# Patient Record
Sex: Male | Born: 1955 | Race: White | Hispanic: No | Marital: Married | Smoking: Never smoker
Health system: Southern US, Community
[De-identification: ages and names within clinical notes are randomized; demographics above are authoritative.]

## PROBLEM LIST (undated history)

## (undated) DIAGNOSIS — I2510 Atherosclerotic heart disease of native coronary artery without angina pectoris: Secondary | ICD-10-CM

## (undated) DIAGNOSIS — I10 Essential (primary) hypertension: Secondary | ICD-10-CM

## (undated) DIAGNOSIS — E785 Hyperlipidemia, unspecified: Secondary | ICD-10-CM

## (undated) HISTORY — DX: Atherosclerotic heart disease of native coronary artery without angina pectoris: I25.10

## (undated) HISTORY — DX: Hyperlipidemia, unspecified: E78.5

---

## 2001-07-11 ENCOUNTER — Ambulatory Visit (HOSPITAL_COMMUNITY): Admission: RE | Admit: 2001-07-11 | Discharge: 2001-07-11 | Payer: Self-pay | Admitting: *Deleted

## 2001-07-11 ENCOUNTER — Encounter: Payer: Self-pay | Admitting: *Deleted

## 2006-06-09 ENCOUNTER — Ambulatory Visit (HOSPITAL_COMMUNITY): Admission: RE | Admit: 2006-06-09 | Discharge: 2006-06-09 | Payer: Self-pay | Admitting: *Deleted

## 2006-06-09 ENCOUNTER — Ambulatory Visit: Payer: Self-pay | Admitting: *Deleted

## 2006-06-12 ENCOUNTER — Ambulatory Visit (HOSPITAL_COMMUNITY): Admission: RE | Admit: 2006-06-12 | Discharge: 2006-06-14 | Payer: Self-pay | Admitting: Internal Medicine

## 2006-06-12 ENCOUNTER — Ambulatory Visit: Payer: Self-pay | Admitting: Internal Medicine

## 2006-07-05 ENCOUNTER — Ambulatory Visit: Payer: Self-pay | Admitting: *Deleted

## 2006-08-25 ENCOUNTER — Ambulatory Visit: Payer: Self-pay | Admitting: Cardiology

## 2006-08-29 ENCOUNTER — Ambulatory Visit (HOSPITAL_COMMUNITY): Admission: RE | Admit: 2006-08-29 | Discharge: 2006-08-30 | Payer: Self-pay | Admitting: Cardiology

## 2006-08-29 ENCOUNTER — Ambulatory Visit: Payer: Self-pay | Admitting: Cardiology

## 2006-09-08 ENCOUNTER — Ambulatory Visit: Payer: Self-pay | Admitting: Cardiology

## 2006-11-20 ENCOUNTER — Ambulatory Visit: Payer: Self-pay | Admitting: Cardiology

## 2007-04-05 ENCOUNTER — Ambulatory Visit: Payer: Self-pay | Admitting: Internal Medicine

## 2007-09-21 ENCOUNTER — Ambulatory Visit: Payer: Self-pay | Admitting: Cardiology

## 2008-07-15 ENCOUNTER — Ambulatory Visit: Admission: RE | Admit: 2008-07-15 | Discharge: 2008-07-15 | Payer: Self-pay | Admitting: Internal Medicine

## 2008-11-26 DIAGNOSIS — I248 Other forms of acute ischemic heart disease: Secondary | ICD-10-CM

## 2008-11-26 DIAGNOSIS — I10 Essential (primary) hypertension: Secondary | ICD-10-CM | POA: Insufficient documentation

## 2008-12-26 ENCOUNTER — Ambulatory Visit: Payer: Self-pay | Admitting: Cardiology

## 2009-12-17 ENCOUNTER — Encounter: Payer: Self-pay | Admitting: Cardiology

## 2009-12-18 ENCOUNTER — Encounter: Payer: Self-pay | Admitting: Cardiology

## 2009-12-21 DIAGNOSIS — E785 Hyperlipidemia, unspecified: Secondary | ICD-10-CM

## 2009-12-21 DIAGNOSIS — I2510 Atherosclerotic heart disease of native coronary artery without angina pectoris: Secondary | ICD-10-CM

## 2009-12-22 ENCOUNTER — Ambulatory Visit: Payer: Self-pay | Admitting: Cardiology

## 2009-12-23 ENCOUNTER — Encounter: Payer: Self-pay | Admitting: Cardiology

## 2010-01-06 ENCOUNTER — Ambulatory Visit (HOSPITAL_COMMUNITY): Admission: RE | Admit: 2010-01-06 | Discharge: 2010-01-06 | Payer: Self-pay | Admitting: Internal Medicine

## 2010-02-24 ENCOUNTER — Ambulatory Visit (HOSPITAL_COMMUNITY): Admission: RE | Admit: 2010-02-24 | Discharge: 2010-02-24 | Payer: Self-pay | Admitting: Internal Medicine

## 2010-03-22 ENCOUNTER — Ambulatory Visit (HOSPITAL_COMMUNITY): Admission: RE | Admit: 2010-03-22 | Discharge: 2010-03-22 | Payer: Self-pay | Admitting: Internal Medicine

## 2010-03-22 ENCOUNTER — Telehealth (INDEPENDENT_AMBULATORY_CARE_PROVIDER_SITE_OTHER): Payer: Self-pay

## 2010-06-18 ENCOUNTER — Encounter: Payer: Self-pay | Admitting: Gastroenterology

## 2010-06-28 ENCOUNTER — Encounter: Payer: Self-pay | Admitting: Gastroenterology

## 2010-06-29 ENCOUNTER — Encounter: Payer: Self-pay | Admitting: Gastroenterology

## 2010-06-30 ENCOUNTER — Encounter: Payer: Self-pay | Admitting: Gastroenterology

## 2010-07-05 ENCOUNTER — Telehealth: Payer: Self-pay | Admitting: Cardiology

## 2010-07-07 ENCOUNTER — Telehealth: Payer: Self-pay | Admitting: Cardiology

## 2010-07-07 ENCOUNTER — Telehealth (INDEPENDENT_AMBULATORY_CARE_PROVIDER_SITE_OTHER): Payer: Self-pay

## 2010-07-13 ENCOUNTER — Ambulatory Visit: Payer: Self-pay | Admitting: Gastroenterology

## 2010-07-13 ENCOUNTER — Ambulatory Visit (HOSPITAL_COMMUNITY): Admission: RE | Admit: 2010-07-13 | Discharge: 2010-07-13 | Payer: Self-pay | Admitting: Gastroenterology

## 2010-08-06 ENCOUNTER — Telehealth (INDEPENDENT_AMBULATORY_CARE_PROVIDER_SITE_OTHER): Payer: Self-pay

## 2010-08-11 ENCOUNTER — Telehealth (INDEPENDENT_AMBULATORY_CARE_PROVIDER_SITE_OTHER): Payer: Self-pay | Admitting: *Deleted

## 2010-08-27 ENCOUNTER — Emergency Department (HOSPITAL_COMMUNITY)
Admission: EM | Admit: 2010-08-27 | Discharge: 2010-08-27 | Payer: Self-pay | Source: Home / Self Care | Admitting: Emergency Medicine

## 2010-08-27 ENCOUNTER — Telehealth: Payer: Self-pay | Admitting: Cardiology

## 2011-01-16 LAB — CONVERTED CEMR LAB
ALT: 20 units/L
AST: 18 units/L
Albumin: 4.3 g/dL
Calcium: 9.1 mg/dL
Cholesterol: 128 mg/dL
Creatinine, Ser: 0.75 mg/dL
Glucose, Bld: 99 mg/dL
Sodium: 140 meq/L
Total Protein: 7 g/dL

## 2011-01-18 NOTE — Assessment & Plan Note (Signed)
Summary: per check out   Visit Type:  Follow-up Primary Provider:  dr [facility] in [place]  CC:  occ eposides of chest pain.  History of Present Illness: Patient is 55 years old and return for management of CAD. 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 In 2007 he had drug-eluting stent at the ostium and midportion of the PDA and later a DES to the LAD. Within a year he had PCI for in-stent restenosis at the ostium of the RCA by Dr. Riley Kill.  He has done quite well since that time has had no recent chest pain shortness breath or palpitations. He's not been too active physically. He does have a fair amount of stress in his job.  His other problems include hypertension hyperlipidemia. His recent lipid profile the [facility] in [place] and they're faxing [place] the results.  He has a left breast mass that may need biopsy.  Current Medications (verified): 1)  Coreg Cr 40 Mg Xr24h-Cap (Carvedilol Phosphate) .Marland Kitchen.. 1 Tab Once Daily 2)  Hydrochlorothiazide 12.5 Mg Tabs (Hydrochlorothiazide) .... Take One Tablet By Mouth Daily. 3)  Plavix 75 Mg Tabs (Clopidogrel Bisulfate) .... Take One Tablet By Mouth Daily 4)  Tricor 145 Mg Tabs (Fenofibrate) .Marland Kitchen.. 1 Tab Qd 5)  Crestor 10 Mg Tabs (Rosuvastatin Calcium) .... Take One Tablet By Mouth Daily. 6)  Lisinopril 40 Mg Tabs (Lisinopril) .... Take One Tablet By Mouth Daily 7)  Aspirin Ec 325 Mg Tbec (Aspirin) .... Take One Tablet By Mouth Daily 8)  Fish Oil   Oil (Fish Oil) .... (1,000mg  Per Tablet) 1 Tab Four Times A Day 9)  Multivitamins   Tabs (Multiple Vitamin) .Marland Kitchen.. 1 Tab Once Daily 10)  Nitrolingual 0.4 Mg/spray Soln (Nitroglycerin) .... One Spray Under Tongue Every 5 Minutes As Needed For Chest Pain---May Repeat Times Three Prn  Allergies (verified): No Known Drug Allergies  Past History:  Past Medical History: Reviewed history from 11/26/2008 and no changes required. PRESENTATION 6/07  ACUTE CORONARY SYNDROME 2 VESSEL  CAD TAXUS STENTS TO OSTIAL AND MID PDA OF RCA, TAXUS STENT TO PROX LAD, BMS TO MID RCA AT CATH 6/07 CUTTING  BALLOON ANGIOPLASTY FOR ISR OF OSTIAL LESION PDA 9/07 HTN DYSLIPIDEMIA FAMILY H/O OF CAD BORDERLINE DM  Review of Systems       ROS is negative except as outlined in HPI.   Vital Signs:  Patient profile:   55 year old male Height:      68 inches Weight:      238 pounds BMI:     36.32 Pulse rate:   68 / minute BP sitting:   140 / 72  (left arm) Cuff size:   regular  Vitals Entered By: Burnett Kanaris, CNA (December 22, 2009 10:54 AM)  Physical Exam  Additional Exam:  Gen. Well-nourished, in no distress   Neck: No JVD, thyroid not enlarged, no carotid bruits Lungs: No tachypnea, clear without rales, rhonchi or wheezes Cardiovascular: Rhythm regular, PMI not displaced,  heart sounds  normal, no murmurs or gallops, no peripheral edema, pulses normal in all 4 extremities. Abdomen: BS normal, abdomen soft and non-tender without masses or organomegaly, no hepatosplenomegaly. MS: No deformities, no cyanosis or clubbing   Neuro:  No focal sns   Skin:  no lesions    Impression & Recommendations:  Problem # 1:  CAD, NATIVE VESSEL (ICD-414.01) He had a DES  to the right coronary artery and LAD in 2007. He's had no recent  chest pain and his palm appears stable.  He may need to have a breast biopsy. I think it is okay for him to come off the Plavix for 5 days prior to the biopsy. I prefer that he stay on aspirin through the biopsy. If this can be done under local anesthesia no taking need further preop evaluation. It is done under general anesthesia then I would recommend that he have a stress Myoview prior to surgery. His updated medication list for this problem includes:    Coreg Cr 40 Mg Xr24h-cap (Carvedilol phosphate) .Marland Kitchen... 1 tab once daily    Plavix 75 Mg Tabs (Clopidogrel bisulfate) .Marland Kitchen... Take one tablet by mouth daily    Lisinopril 40 Mg Tabs (Lisinopril) .Marland Kitchen... Take one  tablet by mouth daily    Aspirin Ec 325 Mg Tbec (Aspirin) .Marland Kitchen... Take one tablet by mouth daily    Nitrostat 0.4 Mg Subl (Nitroglycerin) .Marland Kitchen... 1 tablet under tongue at onset of chest pain; you may repeat every 5 minutes for up to 3 doses.  Orders: EKG w/ Interpretation (93000)  Problem # 2:  HYPERTENSION, BENIGN (ICD-401.1) His blood pressure is slightly elevated today but he says it runs about 130/70 when he checks it at home. We will continue current therapy. His updated medication list for this problem includes:    Coreg Cr 40 Mg Xr24h-cap (Carvedilol phosphate) .Marland Kitchen... 1 tab once daily    Hydrochlorothiazide 12.5 Mg Tabs (Hydrochlorothiazide) .Marland Kitchen... Take one tablet by mouth daily.    Lisinopril 40 Mg Tabs (Lisinopril) .Marland Kitchen... Take one tablet by mouth daily    Aspirin Ec 325 Mg Tbec (Aspirin) .Marland Kitchen... Take one tablet by mouth daily  His updated medication list for this problem includes:    Coreg Cr 40 Mg Xr24h-cap (Carvedilol phosphate) .Marland Kitchen... 1 tab once daily    Hydrochlorothiazide 12.5 Mg Tabs (Hydrochlorothiazide) .Marland Kitchen... Take one tablet by mouth daily.    Lisinopril 40 Mg Tabs (Lisinopril) .Marland Kitchen... Take one tablet by mouth daily    Aspirin Ec 325 Mg Tbec (Aspirin) .Marland Kitchen... Take one tablet by mouth daily  Problem # 3:  HYPERLIPIDEMIA-MIXED (ICD-272.4)  He had a recent lipid profile at the free clinic in [place]. We will try and obtain those results. His updated medication list for this problem includes:    Tricor 145 Mg Tabs (Fenofibrate) .Marland Kitchen... 1 tab qd    Crestor 10 Mg Tabs (Rosuvastatin calcium) .Marland Kitchen... Take one tablet by mouth daily.  His updated medication list for this problem includes:    Tricor 145 Mg Tabs (Fenofibrate) .Marland Kitchen... 1 tab qd    Crestor 10 Mg Tabs (Rosuvastatin calcium) .Marland Kitchen... Take one tablet by mouth daily.  Patient Instructions: 1)  Your physician wants you to follow-up in: 1 year with Dr. Diona Browner in [place].   You will receive a reminder letter in the mail two months in  advance. If you don't receive a letter, please call our office to schedule the follow-up appointment. Prescriptions: NITROSTAT 0.4 MG SUBL (NITROGLYCERIN) 1 tablet under tongue at onset of chest pain; you may repeat every 5 minutes for up to 3 doses.  #25 x 3   Entered by:   Sherri Rad, RN, BSN   Authorized by:   Lenoria Farrier, MD, [facility]   Signed by:   Sherri Rad, RN, BSN on 12/22/2009   Method used:   Faxed to ...       [facility] [address] [employer]. (retail)       [address]       [place], [place]  [address]       Ph: [phone]       Fax: [phone]   RxID:   [id number] LISINOPRIL 40 MG TABS (LISINOPRIL) Take one tablet by mouth daily  #90 x 3   Entered by:   Sherri Rad, RN, BSN   Authorized by:   Lenoria Farrier, MD, [facility]   Signed by:   Sherri Rad, RN, BSN on 12/22/2009   Method used:   Faxed to ...       [facility] [address] [employer] (retail)       [address]       [place], [place]  [address]       Ph: [phone]       Fax: [phone]   RxID:   [id number] CRESTOR 10 MG TABS (ROSUVASTATIN CALCIUM) Take one tablet by mouth daily.  #90 x 3   Entered by:   Sherri Rad, RN, BSN   Authorized by:   Lenoria Farrier, MD, [facility]   Signed by:   Sherri Rad, RN, BSN on 12/22/2009   Method used:   Faxed to ...       [facility] [address] [employer] (retail)       [address]       [place], [place]  [address]       Ph: [phone]       Fax: [phone]   RxID:   [id number] TRICOR 145 MG TABS (FENOFIBRATE) 1 tab qd  #90 x 3   Entered by:   Sherri Rad, RN, BSN   Authorized by:   Lenoria Farrier, MD, [facility]   Signed by:   Sherri Rad, RN, BSN on 12/22/2009   Method used:   Faxed to ...       [facility] [address] [employer] (retail)       [address]       [place], [place]  [address]       Ph: [phone]       Fax: [phone]   RxID:   [id number] PLAVIX 75 MG TABS (CLOPIDOGREL BISULFATE) Take one tablet by mouth daily  #90 x 3   Entered by:   Sherri Rad, RN, BSN   Authorized by:   Lenoria Farrier, MD, [facility]   Signed by:   Sherri Rad, RN, BSN on 12/22/2009   Method used:   Faxed to ...       [facility] [address] [employer] (retail)       [address]       [place], [place]  [address]       Ph: [phone]       Fax: [phone]   RxID:   [id number] HYDROCHLOROTHIAZIDE 12.5 MG TABS (HYDROCHLOROTHIAZIDE) Take one tablet by mouth daily.  #90 x 3   Entered by:   Sherri Rad, RN, BSN   Authorized by:   Lenoria Farrier, MD, [facility]   Signed by:   Sherri Rad, RN, BSN on 12/22/2009   Method used:   Faxed to ...       [facility] [address] [employer] (retail)       [address]       [place], [place]  [address]       Ph: [phone]       Fax: [phone]   RxID:   [id number] COREG CR 40 MG XR24H-CAP (CARVEDILOL PHOSPHATE) 1 tab once daily  #90 x 3   Entered by:   Sherri Rad, RN, BSN   Authorized by:   Lenoria Farrier, MD, [facility]   Signed by:   Sherri Rad, RN, BSN on 12/22/2009   Method used:   Faxed to ...       [facility] [address] [employer] (retail)       [address]       [place], [place]  [address]       Ph: [phone]       Fax: [phone]   RxID:   [id number]

## 2011-01-18 NOTE — Letter (Signed)
Summary: Internal Other /[facility] note/06/17/2010  Internal Other /[facility] note/06/17/2010   Imported By: Cloria Spring LPN 95/62/1308 65:78:46  _____________________________________________________________________  External Attachment:    Type:   Image     Comment:   External Document

## 2011-01-18 NOTE — Progress Notes (Signed)
Summary: Pt want a call regarding him having surgery**awaiting BB**  Phone Note Call from Patient Call back at Home Phone [phone]   Caller: [facility] Summary of Call: Pt having surgery and want to talk to Dr.Rene Sizelove regarding this matter and him taking Plavix Initial call taken by: Judie Grieve,  July 05, 2010 10:31 AM  Follow-up for Phone Call        pt having colonscopy on next Mon 7/25 and also will need surgery for diverticulitis, needs clearance to be off asa and plavix, will send to Dr Juanda Chance for review Meredith Staggers, RN  July 05, 2010 11:16 AM      Appended Document: Pt want a call regarding him having surgery**awaiting BB** Heather, We can call on Wed. I think he can come off as long as he stays on ASA.  His last PCI was few yrs ago. BB

## 2011-01-18 NOTE — Progress Notes (Signed)
Summary: c/o horrible pain neck down left arm  Phone Note Call from Patient Call back at Home Phone [phone] Call back at [phone]   Caller: Patient Reason for Call: Talk to Nurse Summary of Call: c/o horrible pain worse at night, neck down left arm. unable to lift up arm. h/o stent placement 07.  Initial call taken by: Lorne Skeens,  August 27, 2010 9:59 AM  Follow-up for Phone Call        [facility] Sherri Rad, RN, BSN  August 27, 2010 11:53 AM   Additional Follow-up for Phone Call Additional follow up Details #1::        pt rtn call pls call [phone] ext [phone] Lela Graham  August 27, 2010 12:03 PM   I called and spoke with the pt. He states that he has had left sided arm pain/ shoulder pain that goes to the elbow. He does not have any chest pain. His symptoms are not similar to prior to his stent placement. His pain has been ongoing for a few days. He states it is worse when he lifts his arm. I explained that this sounds more like a rotator cuff problem or possibly a nerve or spine involvement. I have advised the pt to contact his PCP. He will call our office back should he develop any CP. He is agreeable with the above. Additional Follow-up by: Sherri Rad, RN, BSN,  August 27, 2010 12:13 PM

## 2011-01-18 NOTE — Progress Notes (Signed)
Summary: pt's wife calling back re message from dr Juanda Chance   Phone Note Call from Patient   Caller: Patient Reason for Call: Talk to Nurse Summary of Call: pt calling back re message left with dr Caidence Higashi-pls call [phone] Initial call taken by: Glynda Jaeger,  July 07, 2010 2:22 PM  Follow-up for Phone Call        [phone] is the wrong #. I called the pt's home #. It is busy x 2 tries. Sherri Rad, RN, BSN  July 07, 2010 2:37 PM  pt now calling to say pt told to stop plavix before surgery from 7-21 to 7-26-dr Shameeka Silliman has told him not to ever stop it-is this alright Glynda Jaeger  July 07, 2010 4:27 PM I called the pt's wife and informed her that Dr. Juanda Chance is in the office tomorrow and we will review and call her back then. Follow-up by: Sherri Rad, RN, BSN,  July 07, 2010 5:46 PM  Additional Follow-up for Phone Call Additional follow up Details #1::        pls call patient at [phone] becasue wife had to go out and cant take the call Omer Jack  July 08, 2010 12:37 PM   I reviewed with Dr. Juanda Chance, the pt is ok to hold plavix for 5 days prior to procedure. The pt is aware.  Additional Follow-up by: Sherri Rad, RN, BSN,  July 08, 2010 1:16 PM

## 2011-01-18 NOTE — Progress Notes (Signed)
Summary: PHONE NOTE  Phone Note From Other Clinic   Caller: [facility] Summary of Call: Angelica Chessman from the [facility] called and stated the Mr. Gann has been set up with a Surgeon on Monday August 29th 2011 Initial call taken by: Donzetta Starch

## 2011-01-18 NOTE — Progress Notes (Signed)
Summary: phone note/ abd pain and mucus in stool  Phone Note Call from Patient   Caller: Spouse Summary of Call: Pt's wife called and said pt has been seen by Dr. Darrick Penna at the [facility], and she did colonoscopy on him. He has had  diverticulits. He is having abdominal pain and mucus in stool but no blood in stool. She wanted to know if there is something that he can do, he is afraid that he will end up in the hospital again....and [facility]end. She is aware Dr. Darrick Penna is out of town, and Dr. Jena Gauss is doing procedures but will be informed. Pt's cell phone is [phone]. Please advise. Initial call taken by: Cloria Spring LPN,  August 06, 2010 9:18 AM     Appended Document: phone note/ abd pain and mucus in stool Per Dr. Jena Gauss, pt was told to go to ER if he has alot of pain and problems. He said it is not that bad at this time, but he is concerned as to what will happen over the week-end.  He is aware Dr. Darrick Penna is out until Tues. I told him i will make her aware when she returns.  Appended Document: phone note/ abd pain and mucus in stool Pt having pain and mucous in stool. Did not get worse over the weekend. Pain is gone. No appt with Dr. Malvin Johns. Pt still does not have appt for Surgery. Called [facility] w/ Peggy. Call me and the pt with up date on appt.

## 2011-01-18 NOTE — Progress Notes (Signed)
Summary: Diverticulitis per [name] [facility]  Phone Note From Other Clinic   Caller: [name] [facility] Summary of Call: [name], from the [facility] called in ref to pt. York Spaniel he is having more pain and abd Ct verified diverticulitis. Wants to  know what Dr. Darrick Penna recommends for him.  [name] call back number is [phone] Pt's number....[phone] Please advise. Initial call taken by: Cloria Spring LPN,  March 22, 2010 11:32 AM     Appended Document: Diverticulitis per [facility] with [name]. Pt had CIP/FLAG for 7 days. Pt's med list reviewed. Needs Augmentin 875 mg two times a day for 14 days. OPV in 3 weeks at [facility]. If pt not improved after 7 days then will need OPV sooner.

## 2011-01-18 NOTE — Progress Notes (Signed)
Summary: phone note/ ? holding meds before procedure  Phone Note Call from Patient   Caller: Patient Summary of Call: Pt's wife called and asked if pt was to hold the plavix and ASA before procedure on 07/13/2010. Please advise! Initial call taken by: Cloria Spring LPN,  July 07, 2010 3:40 PM     Appended Document: phone note/ ? holding meds before procedure Hold ASA starting tomorrow. Continue Plavix.  Appended Document: phone note/ ? holding meds before procedure Correction: HOLD PLAVIX STARTING TOMORROW. Continue ASA.  Appended Document: phone note/ ? holding meds before procedure Pt's wife informed.

## 2011-01-18 NOTE — Letter (Signed)
Summary: [facility] from [facility] [place]  records from [facility] [place]   Imported By: Rosine Beat 06/28/2010 15:02:40  _____________________________________________________________________  External Attachment:    Type:   Image     Comment:   External Document

## 2011-01-18 NOTE — Letter (Signed)
Summary: Internal Other /TCS instructions  Internal Other /TCS instructions   Imported By: Cloria Spring LPN 16/09/9603 54:09:81  _____________________________________________________________________  External Attachment:    Type:   Image     Comment:   External Document

## 2011-01-18 NOTE — Miscellaneous (Signed)
Summary: labs  Clinical Lists Changes  Observations: Added new observation of HGBA1C: 6.1 % (12/17/2009 16:59) Added new observation of LDL: 69 mg/dL (16/09/9603 54:09) Added new observation of HDL: 35 mg/dL (81/19/1478 29:56) Added new observation of TRIGLYC TOT: 120 mg/dL (21/30/8657 84:69) Added new observation of CHOLESTEROL: 128 mg/dL (62/95/2841 32:44) Added new observation of CALCIUM: 9.1 mg/dL (12/21/7251 66:44) Added new observation of ALBUMIN: 4.3 g/dL (03/47/4259 56:38) Added new observation of PROTEIN, TOT: 7.0 g/dL (75/64/3329 51:88) Added new observation of SGPT (ALT): 20 units/L (12/17/2009 16:59) Added new observation of SGOT (AST): 18 units/L (12/17/2009 16:59) Added new observation of ALK PHOS: 23 units/L (12/17/2009 16:59) Added new observation of BILI TOTAL: 0.4 mg/dL (41/66/0630 16:01) Added new observation of CREATININE: 0.75 mg/dL (09/32/3557 32:20) Added new observation of BUN: 19 mg/dL (25/42/7062 37:62) Added new observation of BG RANDOM: 99 mg/dL (83/15/1761 60:73) Added new observation of CO2 PLSM/SER: 23 meq/L (12/17/2009 16:59) Added new observation of CL SERUM: 105 meq/L (12/17/2009 16:59) Added new observation of K SERUM: 4.0 meq/L (12/17/2009 16:59) Added new observation of NA: 140 meq/L (12/17/2009 16:59)

## 2011-01-18 NOTE — Letter (Signed)
Summary: Internal Other/triage  Internal Other/triage   Imported By: Cloria Spring LPN 16/09/9603 54:09:81  _____________________________________________________________________  External Attachment:    Type:   Image     Comment:   External Document  Appended Document: Internal Other/triage Please obtain notes from [facility] visit at [facility] and scan in the computer. TRILYTE PREP.  Appended Document: Internal Other/triage Call [facility] and left message for them to fax over the last OV note.  Appended Document: Internal Other/triage Done and entered.

## 2011-01-20 ENCOUNTER — Encounter: Payer: Self-pay | Admitting: Adult Health

## 2011-01-26 NOTE — Miscellaneous (Signed)
  Clinical Lists Changes  Medications: Changed medication from HYDROCHLOROTHIAZIDE 12.5 MG TABS (HYDROCHLOROTHIAZIDE) Take one tablet by mouth daily. to HYDROCHLOROTHIAZIDE 12.5 MG TABS (HYDROCHLOROTHIAZIDE) Take one tablet by mouth daily. - Signed Rx of HYDROCHLOROTHIAZIDE 12.5 MG TABS (HYDROCHLOROTHIAZIDE) Take one tablet by mouth daily.;  #30 x 1;  Signed;  Entered by: Teressa Lower RN;  Authorized by: Joni Reining, NP;  Method used: Electronically to [facility] [address] [place], [place], [place]  [address], Ph: [phone], Fax: [phone]    Prescriptions: HYDROCHLOROTHIAZIDE 12.5 MG TABS (HYDROCHLOROTHIAZIDE) Take one tablet by mouth daily.  #30 x 1   Entered by:   Teressa Lower RN   Authorized by:   Joni Reining, NP   Signed by:   Teressa Lower RN on 01/20/2011   Method used:   Electronically to        [facility]* (retail)       [address]       [place], [place]  [address]       Ph: [phone]       Fax: [phone]   RxID:   [id number]

## 2011-02-03 ENCOUNTER — Ambulatory Visit (INDEPENDENT_AMBULATORY_CARE_PROVIDER_SITE_OTHER): Payer: Self-pay | Admitting: Cardiology

## 2011-02-03 ENCOUNTER — Encounter: Payer: Self-pay | Admitting: Cardiology

## 2011-02-03 DIAGNOSIS — E782 Mixed hyperlipidemia: Secondary | ICD-10-CM

## 2011-02-03 DIAGNOSIS — I2510 Atherosclerotic heart disease of native coronary artery without angina pectoris: Secondary | ICD-10-CM

## 2011-02-03 DIAGNOSIS — I10 Essential (primary) hypertension: Secondary | ICD-10-CM

## 2011-02-04 ENCOUNTER — Encounter: Payer: Self-pay | Admitting: Cardiology

## 2011-02-09 ENCOUNTER — Telehealth (INDEPENDENT_AMBULATORY_CARE_PROVIDER_SITE_OTHER): Payer: Self-pay | Admitting: *Deleted

## 2011-02-09 NOTE — Letter (Signed)
Summary: [facility] (Nuc Med Stress)  [facility]  [address], [place] [address]   Phone: [phone]  Fax: [phone]    Nuclear Medicine 1-Day Stress Test Information Sheet  Re:     [name]   DOB:     09/16/1956 MRN:     [id number] Weight:  Appointment Date: Register at: Appointment Time: Referring MD:  _X__Exercise Stress  __Adenosine   __Dobutamine  __Lexiscan  __Persantine   __Thallium  Urgency: ____1 (next day)   ____2 (one week)    ____3 (PRN)  Patient will receive Follow Up call with results: Patient needs follow-up appointment:  Instructions regarding medication:  How to prepare for your stress test: 1. DO NOT eat or dring 6 hours prior to your arrival time. This includes no caffeine (coffee, tea, sodas, chocolate) if you were instructed to take your medications, drink water with it.  2. DO NOT use any tobacco products for at leaset 8 hours prior to arrival. 3. DO NOT wear dresses or any clothing that may have metal clasps or buttons. 4. Wear short sleeve shirts, loose clothing, and comfortalbe walking shoes. 5. DO NOT use lotions, oils or powder on your chest before the test. 6. The test will take approximately 3-4 hours from the time you arrive until completion. 7. To register the day of the test, go to the Short Stay entrance at [facility]. 8. If you must cancel your test, call [phone] as soon as you are aware. 9. Do not take Hydrochlorothiazide the morning of test. After you arrive for test:   When you arrive at [facility], you will go to Short Stay to be registered. They will then send you to Radiology to check in. The Nuclear Medicine Tech will get you and start an IV in your arm or hand. A small amount of a radioactive tracer will then be injected into your IV. This tracer will then have to circulate for 30-45 minutes. During this time you will wait in the waiting room and you will be able to drink  something without caffeine. A series of pictures will be taken of your heart follwoing this waiting period. After the 1st set of pictures you will go to the stress lab to get ready for your stress test. During the stress test, another small amount of a radioactive tracer will be injected through your IV. When the stress test is complete, there is a short rest period while your heart rate and blood pressure will be monitored. When this monitoring period is complete you will have another set of pictrues taken. (The same as the 1st set of pictures). These pictures are taken between 15 minutes and 1 hour after the stress test. The time depends on the type of stress test you had. Your doctor will inform you of your test results within 7 days after test.    The possibilities of certain changes are possible during the test. They include abnormal blood pressure and disorders of the heart. Side effects of persantine or adenosine can include flushing, chest pain, shortness of breath, stomach tightness, headache and light-headedness. These side effects usually do not last long and are self-resolving. Every effort will be made to keep you comfortable and to minimize complications by obtaining a medical history and by close observation during the test. Emergency equipment, medications, and trained personnel are available to deal with any unusual situation which may arise.  Please notify office at least 48  hours in advance if you are unable to keep this appt.

## 2011-02-09 NOTE — Assessment & Plan Note (Signed)
Summary: due for f/u per pt call/previous Dr.Brodie patient/tg   Visit Type:  Follow-up Primary Provider:  [facility]   History of Present Illness: 55 year old male presents for followup. This is my first meeting with him. He has been followed by Dr. Juanda Chance since 2007 with cardiac history outlined below.  He denies any exertional angina or unusual shortness of breath. Does exercise by using an elliptical machine and also by walking.  When he presented in 2007 with unstable angina he describes experiencing neck and shoulder discomfort, no similar symptoms of late.  He reports compliance with his medications. He has not had any recent followup lab work however. He has also not undergone any followup stress testing since 2007.  Reports no bleeding problems on aspirin and Plavix.  Preventive Screening-Counseling & Management  Alcohol-Tobacco     Smoking Status: never  Current Medications (verified): 1)  Coreg Cr 40 Mg Xr24h-Cap (Carvedilol Phosphate) .Marland Kitchen.. 1 Tab Once Daily 2)  Hydrochlorothiazide 12.5 Mg Tabs (Hydrochlorothiazide) .... Take One Tablet By Mouth Daily. 3)  Plavix 75 Mg Tabs (Clopidogrel Bisulfate) .... Take One Tablet By Mouth Daily 4)  Tricor 145 Mg Tabs (Fenofibrate) .Marland Kitchen.. 1 Tab Qd 5)  Crestor 10 Mg Tabs (Rosuvastatin Calcium) .... Take One Tablet By Mouth Daily. 6)  Lisinopril 40 Mg Tabs (Lisinopril) .... Take One Tablet By Mouth Daily 7)  Aspirin Ec 325 Mg Tbec (Aspirin) .... Take One Tablet By Mouth Daily 8)  Fish Oil   Oil (Fish Oil) .... (1,000mg  Per Tablet) 1 Tab Four Times A Day 9)  Multivitamins   Tabs (Multiple Vitamin) .Marland Kitchen.. 1 Tab Once Daily 10)  Nitrostat 0.4 Mg Subl (Nitroglycerin) .Marland Kitchen.. 1 Tablet Under Tongue At Onset of Chest Pain; You May Repeat Every 5 Minutes For Up To 3 Doses. 11)  Dexilant 60 Mg Cpdr (Dexlansoprazole) .... Take 1 Tab Daily  Allergies (verified): No Known Drug Allergies  Comments:  Nurse/Medical Assistant: patient  brought meds we reviewed [facility]   Past History:  Family History: Last updated: 02/03/2011 Mother: CAD with CABG in her 61s  Social History: Last updated: 02/03/2011 Tobacco Use - No.  Alcohol Use - no Full Time - principal, [employer]  Past Medical History: Hyperlipidemia Hypertension CAD - BMS mid PDA, DES ostial PDA, DES PLA 2007, DES prox LAD, PTCA diagonal 6/07; PTCA PDA 9/07  Past Surgical History: Unremarkable  Family History: Mother: CAD with CABG in her 30s  Social History: Tobacco Use - No.  Alcohol Use - no Full Time - principal, [employer]  Review of Systems  The patient denies anorexia, fever, weight loss, syncope, dyspnea on exertion, peripheral edema, prolonged cough, hemoptysis, melena, hematochezia, and severe indigestion/heartburn.         Otherwise reviewed and negative.  Vital Signs:  Patient profile:   55 year old male Weight:      227 pounds BMI:     34.64 Pulse rate:   58 / minute BP sitting:   129 / 88  (left arm)  Vitals Entered By: Dreama Saa, CNA (February 03, 2011 2:11 PM)  Physical Exam  Additional Exam:  Obese male in no acute distress. HEENT: Conjunctiva and lids normal, oropharynx with moist mucosa. Neck: Supple, no elevated JVP or carotid bruits, no thyromegaly. Lungs: Clear to auscultation, nontender. Cardiac: Regular rate and rhythm, no S3. Abdomen: Soft, nontender, bowel sounds present. Skin: Warm and dry. Extremities: No pitting edema, distal pulses full. Musculoskeletal: No kyphosis. Neuropsychiatric: Alert and  oriented x3, affect appropriate.   Cardiac Cath  Procedure date:  08/29/2006  Findings:       ANGIOGRAPHIC DATA.:  1. Ventriculography in the RAO projection reveals inferobasal hypokinesis.  2. The right coronary artery is a large-caliber vessel with some mild      luminal irregularities throughout the proximal and mid vessel and      calcification.  There is  95% narrowing at the proximal portion of the      posterior descending vessel over about a 2 mm length. Just distal to      this is a previously placed overlapping 2.25-mm stents and there is      some partial renarrowing in the stents of about 40%.  There are several      small posterolateral branches in the continuation branch which has been      previously stented and the drug-eluting stent appears to be widely      patent with excellent runoff.  3. The circumflex, left main and left anterior descending artery are      heavily calcified. The left main is without critical disease.  4. The LAD has about 40% narrowing proximal to a stent. The stent does not      have significant restenosis.  There is some diffuse luminal      irregularity distally. There is a first diagonal branch, and then a      second diagonal branch which is fairly large in caliber and has diffuse      70% proximal narrowing and then 50% narrowing at the previous site of      mid vessel PTCA.  5. The circumflex consists of two large marginal branches and there is 40%      diffuse irregularity in the AV continuation branch.  EKG  Procedure date:  02/03/2011  Findings:      Sinus bradycardia at 59 beats per minute with sinus arrhythmia and single PVC. Nonspecific T-wave changes. Low voltage.  Impression & Recommendations:  Problem # 1:  CAD, NATIVE VESSEL (ICD-414.01)  Symptomatically stable at present on medical therapy. ECG is reviewed, no major changes compared to previous tracing. Patient has not undergone followup ischemic evaluation since 2007. We did discuss this today. He has no health insurance, but is covered through the [employer] financial assistance program. We plan a followup exercise Myoview on medical therapy with conversion to Lexiscan if heart rate does not increase adequately. I would like this study to be done on his full medical regimen to get a better sense of his ischemic burden at baseline. If  this study is low risk, we will plan to continue medical therapy and an annual followup.  His updated medication list for this problem includes:    Coreg Cr 40 Mg Xr24h-cap (Carvedilol phosphate) .Marland Kitchen... 1 tab once daily    Plavix 75 Mg Tabs (Clopidogrel bisulfate) .Marland Kitchen... Take one tablet by mouth daily    Lisinopril 40 Mg Tabs (Lisinopril) .Marland Kitchen... Take one tablet by mouth daily    Aspirin Ec 325 Mg Tbec (Aspirin) .Marland Kitchen... Take one tablet by mouth daily    Nitrostat 0.4 Mg Subl (Nitroglycerin) .Marland Kitchen... 1 tablet under tongue at onset of chest pain; you may repeat every 5 minutes for up to 3 doses.  Orders: Nuclear Stress Test (Nuc Stress Test)  Problem # 2:  HYPERLIPIDEMIA-MIXED (ICD-272.4)  No recent followup labs. A fasting lipid profile and liver function tests will be arranged.  His updated medication list for this  problem includes:    Tricor 145 Mg Tabs (Fenofibrate) .Marland Kitchen... 1 tab qd    Crestor 10 Mg Tabs (Rosuvastatin calcium) .Marland Kitchen... Take one tablet by mouth daily.  Orders: T-Comprehensive Metabolic Panel [id number]) T-Lipid Profile ([id number])  Problem # 3:  HYPERTENSION, BENIGN (ICD-401.1)  Blood pressure is reasonable today. We did discuss diet and exercise.  His updated medication list for this problem includes:    Coreg Cr 40 Mg Xr24h-cap (Carvedilol phosphate) .Marland Kitchen... 1 tab once daily    Hydrochlorothiazide 12.5 Mg Tabs (Hydrochlorothiazide) .Marland Kitchen... Take one tablet by mouth daily.    Lisinopril 40 Mg Tabs (Lisinopril) .Marland Kitchen... Take one tablet by mouth daily    Aspirin Ec 325 Mg Tbec (Aspirin) .Marland Kitchen... Take one tablet by mouth daily  Patient Instructions: 1)  Your physician recommends that you schedule a follow-up appointment in: 1 year 2)  Your physician recommends that you return for lab work in: this week 3)  Your physician has requested that you have an exercise stress myoview.  For further information please visit [email or web address]  Please follow instruction sheet, as given.

## 2011-02-15 ENCOUNTER — Other Ambulatory Visit: Payer: Self-pay | Admitting: Cardiology

## 2011-02-16 ENCOUNTER — Encounter: Payer: Self-pay | Admitting: Cardiology

## 2011-02-17 ENCOUNTER — Encounter (HOSPITAL_COMMUNITY): Payer: Self-pay

## 2011-02-17 ENCOUNTER — Encounter: Payer: Self-pay | Admitting: Adult Health

## 2011-02-17 ENCOUNTER — Encounter (INDEPENDENT_AMBULATORY_CARE_PROVIDER_SITE_OTHER): Payer: Self-pay

## 2011-02-17 ENCOUNTER — Encounter (HOSPITAL_COMMUNITY)
Admission: RE | Admit: 2011-02-17 | Discharge: 2011-02-17 | Disposition: A | Discharge status: Home / Self Care | Payer: Self-pay | Source: Ambulatory Visit | Attending: Cardiology | Admitting: Cardiology

## 2011-02-17 ENCOUNTER — Encounter: Payer: Self-pay | Admitting: Cardiology

## 2011-02-17 DIAGNOSIS — I2510 Atherosclerotic heart disease of native coronary artery without angina pectoris: Secondary | ICD-10-CM

## 2011-02-17 DIAGNOSIS — R079 Chest pain, unspecified: Secondary | ICD-10-CM | POA: Insufficient documentation

## 2011-02-17 DIAGNOSIS — E785 Hyperlipidemia, unspecified: Secondary | ICD-10-CM | POA: Insufficient documentation

## 2011-02-17 DIAGNOSIS — I10 Essential (primary) hypertension: Secondary | ICD-10-CM | POA: Insufficient documentation

## 2011-02-17 HISTORY — DX: Essential (primary) hypertension: I10

## 2011-02-17 MED ORDER — TECHNETIUM TC 99M TETROFOSMIN IV KIT
30.0000 | PACK | Freq: Once | INTRAVENOUS | Status: AC | PRN
  Administered 2011-02-17: 32.6 via INTRAVENOUS

## 2011-02-17 MED ORDER — TECHNETIUM TC 99M TETROFOSMIN IV KIT
10.0000 | PACK | Freq: Once | INTRAVENOUS | Status: AC | PRN
  Administered 2011-02-17: 10.84 via INTRAVENOUS

## 2011-02-24 NOTE — Progress Notes (Signed)
Summary: Medication Question  Phone Note Call from Patient Call back at [phone]   Caller: [name] from [facility] Reason for Call: Talk to Nurse Summary of Call: would like to know if patient can take generic for Coreg CR / pls call her back at above # / tg Initial call taken by: Raechel Ache [facility],  February 09, 2011 2:31 PM  Follow-up for Phone Call        Pt is seen at the [facility].  He takes coreg cr 40mg  daily.   What would be the equivalent carvedilol dose Follow-up by: Teressa Lower RN,  February 10, 2011 4:58 PM  Additional Follow-up for Phone Call Additional follow up Details #1::        Generic equivalent would be carvedilol 12.5 mg p.o. b.i.d. Additional Follow-up by: Loreli Slot, MD, [facility],  February 10, 2011 5:05 PM    Additional Follow-up for Phone Call Additional follow up Details #2::    no answer at [facility] and no way to leave message for nurse. Will call back on Monday. Follow-up by: Larita Fife Via LPN,  February 11, 2011 4:09 PM  Additional Follow-up for Phone Call Additional follow up Details #3:: Details for Additional Follow-up Action Taken: I spoke with [name] at [facility]  changed carvedilol to 12.5mg  bid Additional Follow-up by: Teressa Lower RN,  February 15, 2011 10:53 AM  New/Updated Medications: CARVEDILOL 12.5 MG TABS (CARVEDILOL) Take one tablet by mouth twice a day

## 2011-02-24 NOTE — Letter (Signed)
Summary:  Results Engineer, agricultural at [facility]  [address], [place] [address]   Phone: [phone]  Fax: [phone]      February 17, 2011 MRN: [id number]   KNUTE MAZZUCA [address] [place], [place]  [address]   Dear Mr. Newberry,  Your test ordered by Selena Batten has been reviewed by your physician (or physician assistant) and was found to be normal or stable. Your physician (or physician assistant) felt no changes were needed at this time.  ____ Echocardiogram  __X__ Cardiac Stress Test  ____ Lab Work  ____ Peripheral vascular study of arms, legs or neck  ____ CT scan or X-ray  ____ Lung or Breathing test  ____ Other:   Thank you.   Nona Dell, MD, F.A.C.C

## 2011-05-03 NOTE — Assessment & Plan Note (Signed)
[facility]                            CARDIOLOGY OFFICE NOTE   NAME:Yannuzzi, JERROL HELMERS                   MRN:          [id number]  DATE:12/26/2008                            DOB:          04-28-1956    PRIMARY CARE PHYSICIAN:  Nicoletta Dress. Colon Branch, MD   CLINICAL HISTORY:  Mr. 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  He returns for followup management of his  coronary artery disease.  In 2007, he presented with acute coronary  syndrome, and had a drug-eluting stent placed in the ostium and  midportion of the posterior descending branch in the right coronary  artery and the proximal LAD.  He subsequently had PCI for in-stent  restenosis in the ostium at posterior descending branch by Dr. Riley Kill.  He has done well since that time.  He has had no recent exertional  anginal symptoms and no shortness of breath or palpitations.   PAST MEDICAL HISTORY:  Significant for hypertension and hyperlipidemia  with low HDL.  He has also had borderline glucose.   CURRENT MEDICATIONS:  1. Coreg CR 20 mg daily.  2. Plavix 75 mg daily.  3. Prinivil 40 mg daily.  4. Aspirin 325 mg daily.  5. TriCor 145 mg daily.  6. Crestor 10 mg daily.  7. Multivitamins.  8. Fish oil.   PHYSICAL EXAMINATION:  VITAL SIGNS:  Blood pressure was 148/102, pulse  was 79 and regular.  NECK:  There was no venous distention.  Carotid pulses were full without  bruits.  CHEST:  Clear.  HEART:  Rhythm is regular.  I could hear no murmurs or gallops.  ABDOMEN:  Soft, normal bowel sounds.  There is no hepatosplenomegaly.  Peripheral pulses were full with no peripheral edema.   IMPRESSION:  1. Coronary artery disease status post percutaneous coronary artery      interventions as described above in June and September 2007.  2. Hyperlipidemia with low HDL.  3. Hypertension under optimal control.  4. Borderline glucose tolerance.   RECOMMENDATIONS:  I  think Mr. Koppen is doing fairly well from a  standpoint of his heart.  His blood pressure is a little bit elevated,  and we will increase his Coreg CR from 20-40 a day.  We encouraged him  to increase his exercise, and work hard on his diet, both for his blood  pressure and his cholesterol.  We will plan to see him back in followup  in a year or sooner if he has problems.     Bruce Elvera Lennox Juanda Chance, MD, [facility]  Electronically Signed    BRB/MedQ  DD: 12/26/2008  DT: 12/27/2008  Job #: [id number]

## 2011-05-03 NOTE — Assessment & Plan Note (Signed)
[facility]                            CARDIOLOGY OFFICE NOTE   NAME:[name], [name]                   MRN:          [id number]  DATE:09/21/2007                            DOB:          05-May-1956    PRIMARY CARE PHYSICIAN:  Dr. Colon Branch.   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 In June 2007, he presented with an acute coronary  syndrome and a placement of a drug-eluting stent in the ostium and mid-  portion of the posterior descending branch of the right coronary artery  and in the proximal LAD. He subsequently had PDCA for in-stent  restenosis at the ostial lesion of the posterior descending branch of  the right coronary by Dr. Riley Kill in September 2007.   He has done fairly well since that time. He has had no exertional chest  pain, but he has had some shoulder numbness. This has not been related  to exertion. This is slightly similar to what he had prior to his first  stents, although at that time, he said that his symptoms were  exertional.   PAST MEDICAL HISTORY:  Significant for hypertension and hyperlipidemia  with a low HDL. He also has borderline glucose intolerance.   CURRENT MEDICATIONS:  1. Coreg.  2. Plavix.  3. Prinivil.  4. Aspirin.  5. Fish oil.  6. Tricor.  7. Crestor.   PHYSICAL EXAMINATION:  VITAL SIGNS:  Blood pressure 120/90, pulse 70 and  regular.  NECK:  There was no vein distension. The carotid pulses were full  without bruits.  CHEST:  Clear.  CARDIAC:  Rhythm was regular. I could hear no murmurs or gallops.  ABDOMEN:  Soft without organomegaly.  EXTREMITIES:  Peripheral pulses were full and there was no peripheral  edema.   An electrocardiogram was normal.   IMPRESSION:  1. Coronary artery disease status post percutaneous coronary      interventions in June and September 2007.  2. Older numbness of uncertain etiology.  3. Hyperlipidemia with low HDL  and high triglycerides.  4. Hypertension.  5. Borderline glucose intolerance.   RECOMMENDATIONS:  I think that [name] symptoms are probably not  related to his heart. We will plan to continue to follow him and  continue his current medications. His blood pressure is under good  control and he had recent lipid studies done at the free clinic in  [place]. I will plan to see him back a little earlier than usual at 4  months because of his symptoms. He is to let us know if these symptoms  change.     Bruce Elvera Lennox Juanda Chance, MD, [facility]  Electronically Signed    BRB/MedQ  DD: 09/21/2007  DT: 09/22/2007  Job #: [id number]

## 2011-05-06 NOTE — Assessment & Plan Note (Signed)
[facility]                              CARDIOLOGY OFFICE NOTE   NAME:[name], [name]                   MRN:          [id number]  DATE:09/08/2006                            DOB:          07-22-1956    PRIMARY CARE PHYSICIAN:  Dr. Colon Branch at [facility] in [place].   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  He has documented  coronary disease and in June of this year developed exertional chest pain,  and was found to have severe obstructive coronary disease and underwent  placement of drug-eluting stents in the ostium and mid posterior descending  branch of the right coronary artery, posterolateral branch of the right  coronary artery, and proximal LAD.  He did well until recently when he  developed recurrent symptoms and was brought back in for restudy and Dr.  Riley Kill did a cutting balloon angioplasty of a peri-stent lesion of the  ostium of the right coronary artery.   He has done well since that time.  He has had no recurrence of his symptoms.  He is now back to his full activities.   PAST MEDICAL HISTORY:  Significant for hypertension, hyperlipidemia, and  borderline glucose intolerance.   CURRENT MEDICATIONS:  Include Coreg, Plavix, Prinivil, gemfibrozil, aspirin,  vitamin E, and fish oil.   EXAMINATION:  The blood pressure is 115/76 and the pulse 62 and regular.  There was no venous distention.  The carotid pulses were full without  bruits.  The chest was clear.  Cardiac rhythm was regular.  He had no murmurs or gallops.  ABDOMEN:  Soft without organomegaly.  Peripheral pulses were full.  There was no peripheral edema.   The electrocardiogram was normal.   IMPRESSION:  1. Coronary artery disease, status post multiple percutaneous      interventions as described above; the last of which was cutting balloon      angioplasty for an  ostial stent peri-stent restenosis in the posterior      descending branch of the right coronary artery, now stable.  2. Hyperlipidemia with high triglycerides, low HDL, and elevated LDL.  3. Hypertension.  4. Glucose intolerance.   RECOMMENDATIONS:  I think [name] is doing well at present.  His lipid  profile that we got in the hospital was not at target with a total  cholesterol of 166, a triglyceride of 211, a HDL of 23, and a LDL of 101.  I  discussed with [name] and I think he needs to be both on a statin and a  fibrate or fenofibrate and she feels the __________  approach will be better  if we use TriCor.  For this reason, we will plan to start him on TriCor 145  mg a day and Crestor 10 mg a day when he finishes his current supply of  gemfibrozil.  We will get a lipid and liver profile a month later.  I will  see him back in followup here in 3 months.  Bruce Elvera Lennox Juanda Chance, MD, [facility]    BRB/MedQ  DD:  09/08/2006  DT:  09/11/2006  Job #:  [id number]   cc:   Nicoletta Dress. Colon Branch, M.D.

## 2011-05-06 NOTE — Assessment & Plan Note (Signed)
[facility]                         [place] CARDIOLOGY OFFICE NOTE   NAME:Tavenner, OLOF MARCIL                   MRN:          [id number]  DATE:07/05/2006                            DOB:          10-02-1956    PRIMARY PHYSICIAN:  [facility].   Mr. Hearn returns after his catheterization and percutaneous  revascularization.  He had two vessel coronary disease in his right and LAD.  He got three separated drug-eluting stents, all Taxus stents, two to his  right coronary artery, one to his LAD, and he also got a PCI of his diagonal  branch to his LAD without a stent. He also got a bare metal stent to the mid  portion of his right coronary artery, so quite an extensive  revascularization.  He has done reasonably well.  He has an occasional  fleeting seconds long episode of sharp chest pain but no shortness of  breath.  No exertional component to his discomfort.  His groin has healed  well.  He has no leg pain.   CURRENT MEDICATIONS:  1.  Plavix 75 mg once a day.  2.  Aspirin 325 once a day  3.  Coreg 25 mg once a day.  4.  Prinivil 20 mg once a day.  5.  Gemfibrozil 1200 mg once a day.  6.  Fish oil 3000 mg a day.  7.  He takes vitamin E.   PHYSICAL EXAMINATION:  VITAL SIGNS:  On physical exam today, he is 232  pounds.  His blood pressure is 110/78, pulse 64.  NECK:  He has no jugular venous distention or carotid bruits.  CHEST:  Clear.  CARDIOVASCULAR:  Regular with no significant murmurs.  EXTREMITIES:  Lower extremities are without significant clubbing, cyanosis  or edema.  He does have a mild ecchymotic area in his left thigh.  His groin  site, however, looks very well healed.  He has no bruits.  Pulses are 1+ in  his lower extremities.   Electrocardiogram shows a sinus rhythm at a rate of 64.  Normal intervals.  Axis is just slightly leftward at -12.  No ischemic ST-T wave changes.  No Q  waves.  Essentially normal  electrocardiogram.   I told Mr. Willems we need to check his lipids in about eight weeks.  We  need to follow those avidly.  We talked about hypertension and what his  goals for blood pressure were.  We talked about his glucose intolerance and  his diabetes, how we had to follow his hemoglobin A1C and recommended that  be done aggressively.  We talked about exercise.  We talked about the use  of nitroglycerin and how that should be done.  Finally, I recommended that  we see him back in eight weeks for a lipid check and in six months for  routine followup.                                   Farris Has. Dorethea Clan, MD   JMH/MedQ  DD:  07/05/2006  DT:  07/05/2006  Job #:  [id number]   cc:   Molly Maduro L. Foy Gu[place]ara, MD

## 2011-05-06 NOTE — Discharge Summary (Signed)
NAME:  RACHIT, GRIM NO.:  [id number]   MEDICAL RECORD NO.:  [id number]          PATIENT TYPE:  OIB   LOCATION:  6526                         FACILITY:  [facility]   PHYSICIAN:  Nicolasa Ducking, ANP DATE OF BIRTH:  [birth year]   DATE OF ADMISSION:  08/29/2006  DATE OF DISCHARGE:  08/30/2006                                 DISCHARGE SUMMARY   PRIMARY CARDIOLOGIST:  Dr. Charlies Constable.   PRINCIPAL DIAGNOSIS:  Unstable angina/coronary artery disease.   SECONDARY DIAGNOSES:  1. Hypertension,  2. Hyperlipidemia.  3. History of glucose intolerance.  4. Status post tonsillectomy at young age.   ALLERGIES:  NO KNOWN DRUG ALLERGIES.   PROCEDURES:  Left heart cardiac catheterization with successful cutting  balloon angioplasty of the ostial posterior descending artery   HISTORY OF PRESENT ILLNESS:  A 55 year old male with history of CAD status  post stenting of his RCA and LAD in June 2007.  Since his procedure, he has  been having rest and exertional trapezial burning that was different from  his previous angina, and he was seen in clinic and decision was made to  pursue cardiac catheterization to reevaluate his coronary anatomy.   HOSPITAL COURSE:  He underwent left heart cardiac catheterization on  September 11 revealing patency of the previously placed LAD stent with a 95%  ostial hazy stenosis in the ostial PDA.  This was successfully treated with  cutting balloon angioplasty.  Postprocedure, his cardiac markers and EKG  have remained unchanged.  He is being discharged home today in satisfactory  condition.   DISCHARGE LABORATORIES:  Hemoglobin 14.2, hematocrit 40.2, WBC 6.2,  platelets 222,000.  MCV 91.2.  Sodium 141, potassium 3.5, chloride 107, CO2  29, BUN 9, creatinine 1.0, glucose 108, total bilirubin 1.0, alkaline  phosphatase 45, AST 22, ALT 20, albumin 3.9, CK 45, MB 1.0, total  cholesterol 172, triglycerides 149, HDL 29, LDL 113, calcium 9.3.   DISPOSITION:  Patient is being discharged home today in good condition.   FOLLOW-UP APPOINTMENTS:  He has a follow-up up with Dr. Charlies Constable on  September 21 at 3:15 p.m..   DISCHARGE MEDICATIONS:  1. Aspirin 325 mg.  2. Plavix 75 mg daily.  3. Prinivil 20 mg daily.  4. Gemfibrozil 1200 mg daily.  5. Coreg CR 20 mg daily.  6. Nitroglycerin 0.4 mg sublingual p.r.n. chest pain.  7. Fish oil 3 g daily.   OUTSTANDING LABORATORY STUDIES:  None.   DURATION OF DISCHARGE ENCOUNTER:  Duration was 35 minutes, including  physician time.           ______________________________  Nicolasa Ducking, ANP     CB/MEDQ  D:  08/30/2006  T:  08/30/2006  Job:  [id number]

## 2011-05-06 NOTE — Cardiovascular Report (Signed)
NAME:  [name], [name] NO.:  [id number]   MEDICAL RECORD NO.:  [id number]          PATIENT TYPE:  OIB   LOCATION:  6527                         FACILITY:  [facility]   PHYSICIAN:  Arvilla Meres, M.D. LHCDATE OF BIRTH:  [birth year]   DATE OF PROCEDURE:  06/12/2006  DATE OF DISCHARGE:                              CARDIAC CATHETERIZATION   PRIMARY CARE PHYSICIAN:  [facility] [place].   CARDIOLOGIST:  Dr. Dionicio Stall.   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  He was seen by Dr. Dorethea Clan who referred him for cardiac  catheterization.   PROCEDURE PERFORMED:  1.  Selective coronary angiography.  2.  Left heart catheterization.  3.  Left ventriculogram.   DESCRIPTION OF PROCEDURE:  The risks and benefits of the procedure were  explained.  Consent was signed and placed on the chart.  A 6-French arterial  sheath was placed in the right femoral artery using a modified Seldinger  technique.  Standard catheters including preformed Judkins JL-4, JR-4 and  angled pigtail used for procedure. All catheter exchanges were made over  wire.  There are no apparent complications.   Central aortic pressure is 120/86 with mean of 103.  LV pressure is 119/5  and LVEDP of 12.  There is no aortic stenosis.   Left main was normal.   LAD was a large vessel coursing to the apex.  It was heavily calcified in  the proximal to mid section with apparent 70-80% stenosis covering the  takeoff of a large first diagonal.  There was a small second diagonal.  In  the mid portion of the first diagonal, there was a focal 90-95% lesion.   Left circumflex is a moderate-sized vessel that gave off a large OM-1, a  moderate-sized OM-2, and a small OM-3.  There was a 40% stenosis in the  ostium of the  OM-2.   Right coronary artery was a very large dominant vessel that gave off a PDA,  three small posterolaterals, and a large fourth posterolateral.  There is a  40% lesion proximally followed by diffuse 20% throughout the mid portion of  the vessel.  Distally right before the PDA, there was a 50-60% lesion that  appeared to be ruptured plaque with a clot.  The PDA had a 90% ostial lesion  and then was totally occluded with a clot.  In the distal RCA just prior to  the takeoff of the fourth posterolateral, there was a 90% focal stenosis.   Left ventriculogram done in the RAO position showed an EF of 70% with no  regional wall motion abnormalities.  There was evidence of mild mitral valve  prolapse without significant mitral regurgitation.   ASSESSMENT:  1.  Severe two-vessel coronary artery disease described as above.  2.  Normal left ventricular function.  3.  Mild mitral valve prolapse without significant mitral regurgitation.   PLAN AND DISCUSSION:  I have reviewed the films with Dr.  Brodie. Obviously  the distal right coronary lesions are the culprit.  At this point, we will  proceed with percutaneous intervention on the right coronary artery and the  PDA.  He will need a staged intervention on the LAD and diagonal.      Arvilla Meres, M.D. [facility]  Electronically Signed     DB/MEDQ  D:  06/12/2006  T:  06/12/2006  Job:  [id number]

## 2011-05-06 NOTE — Cardiovascular Report (Signed)
NAME:  [name], [name] NO.:  [id number]   MEDICAL RECORD NO.:  [id number]          PATIENT TYPE:  OIB   LOCATION:  6527                         FACILITY:  [facility]   PHYSICIAN:  Charlies Constable, M.D. [facility] DATE OF BIRTH:  27-Sep-1956   DATE OF PROCEDURE:  06/12/2006  DATE OF DISCHARGE:                              CARDIAC CATHETERIZATION   PROCEDURE:  Percutaneous coronary intervention.   HISTORY/>  [name] is 55 years old and has developed exertional chest pain.  He  was seen by Dr. Dorethea Clan and scheduled for evaluation with angiography, which  was performed earlier today by Dr. Nicholes Mango.  This showed a 70-80%  lesion in the proximal LAD, no major obstruction of the circumflex artery,  and total occlusion of the mid posterior descending artery with 70-80%  stenosis of the ostial posterior block artery and 90% stenosis in the large  posterolateral branch.  There was also an ectatic area the distal right  coronary, which appeared initially to possibly be a filling defect but  filled completely with contrast was probably related to nonlaminar flow due  to the ectasia.  We elected to treat the posterior descending and  posterolateral branches.   PROCEDURE:  The procedure was performed via the femoral artery using a 6-  [place] AL-1 guiding catheter with side holes.  We passed a PT-2 light  support wire down the posterior descending branch with some difficulty.  We  predilated with a 2.0 x 20 mm  Maverick, performing 1 inflation up to 8  atmospheres for 30 seconds.  We then deployed a 2.25 x 18 mm mini Vision  stent, deploying this with 1 inflation of 11 atmospheres for 30 seconds.  We  postdilated with a 2.5 x 12-mm Quantum Maverick, performing 2 inflation to  16 inches for 30 seconds.  We then treated the posterolateral branch,  leaving the wire in the posterior descending branch.  We passed a second PT-  2 light support wire down the right coronary and across the  lesion in the  posterior lateral branch.  We then went in with a 2.0 x 20-mm Maverick and  performed 1 up to 8 atmospheres for 30 seconds.  We then attempted to pass a  2.5 x 20 mm Taxus stent down to the lesion but were unable to cross the  lesion due to multiple bends in the vessel.  We then removed the PT-2 wire  from the posterior descending branch and passed this down to the a  posterolateral branch to act as a buddy wire.  With a great deal of  difficulty we were able to advance the Taxus stent around the bend in the  posterolateral branch and position it at the lesion.  We then deployed the  stent with 1 inflation up to 12 atmospheres for 30 seconds.  We removed the  buddy wire after deploying the stent.  We then postdilated with a 2.5 x 12-  mm Quantum Maverick, performing 2 inflation up to 16 atmospheres for 30  seconds.   At this point we elected to treat the ostium of posterior descending  branch,  so we rerouted wire down the posterior descending branch with some  difficulty.  We then used a 2.25 x 6 mm cutting balloon and then performed 3  dilatations up to 10 atmospheres for 30 seconds.  This gave a suboptimal  result, so we elected to stent the ostium.  We chose a 2.5 x 8 mm Taxus  stent.  We had a great deal of difficulty advancing the stent into the  ostium and had to use a second PT-2 light support wire as a buddy wire.  With the help of a buddy wire we were able to position the stent at the  lesion at the ostium.  We removed the buddy wire, then deployed the stent  with 1 inflation up to 12 inches for 30 seconds.  We pulled the balloon back  and performed a second inflation up to 16 atmospheres for 30 seconds.  A  final diagnostic study was then performed through the guiding catheter.  It  was a long, difficult procedure but the patient tolerated the procedure and  left the laboratory in satisfactory condition.   RESULTS:  Initially the stenosis in the midportion of the  posterior branch  was completely occluded.  Following stenting this improved to 0%.   The stenosis in the ostial posterior branch was initially 70-80%.  Following  stenting this improved to less than 10%.   The lesion in the posterolateral branch of the right coronary was initially  90%.  Following stenting this improved to 0%.   CONCLUSION:  1.  Successful percutaneous coronary intervention of the mid and ostial      lesions in the posterior branch of the right coronary artery with      improvement in the mid lesion from 100% to 0% using a bare metal stent      and in the ostial lesion from 90% to 10% using a Taxus drug-eluting      stent.  2.  Successful percutaneous coronary intervention of the lesion in the      posterolateral branch of the right coronary using a Taxus drug-eluting      stent improvement with improvement in sentinel narrowing 90% to 0%.   DISPOSITION:  The patient was __________ for further observation.           ______________________________  Charlies Constable, M.D. [facility]     BB/MEDQ  D:  06/12/2006  T:  06/13/2006  Job:  [id number]   cc:   Vida Roller, M.D.  Fax: [phone]   Arvilla Meres, M.D. [facility]  [facility]  [address]  [place]  [place] [address]   Cardiopulmonary Lab

## 2011-05-06 NOTE — Assessment & Plan Note (Signed)
[facility]                            CARDIOLOGY OFFICE NOTE   NAME:[name], [name]                   MRN:          [id number]  DATE:11/20/2006                            DOB:          [birth year]    ADDENDUM:  We will plan to see [name] back in 4 months. He asked  about a stress test and I told him we would consider that at 4 months,  but since he is self pay, will take that into consideration. I  encouraged him to follow a low glycemic diet as well as a diet that is  low in saturated and transfats.     Bruce Elvera Lennox Juanda Chance, MD, [facility]     BRB/MedQ  DD: 11/20/2006  DT: 11/20/2006  Job #: [id number]

## 2011-05-06 NOTE — Assessment & Plan Note (Signed)
[facility]                            CARDIOLOGY OFFICE NOTE   NAME:[name], [name]                   MRN:          [id number]  DATE:04/05/2007                            DOB:          1956/11/29    PRIMARY CARE PHYSICIAN:  Dr. Colon Branch[facility] [place].   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 In June 2007, he presented with  unstable angina and underwent placement of a drug-eluting stent to the  ostium and mid portion of the posterior descending branch of the right  coronary artery and the posterior lateral branch of the right coronary  artery and proximal LAD. He developed restenosis within the stent in the  ostium in the right coronary artery and underwent cutting-balloon  angioplasty by [name] in September 2007. He has done fairly well  since that time. He says he has had no recent chest pain, shortness of  breath or palpitations. He had some stress involving the school but he  is getting ready to move into the summer session which is less  stressful.   PAST MEDICAL HISTORY:  Significant for hypertension, hyperlipidemia and  borderline glucose intolerance.   CURRENT MEDICATIONS:  Coreg, Plavix, Prinivil, aspirin, fish oil,  Tricor, Crestor.   PHYSICAL EXAMINATION:  VITAL SIGNS:  Blood pressure is 110/78 and the  pulse is 73 and regular.  NECK:  There was no vein distention. Carotid pulses were full without  bruits.  CHEST:  Clear without rales or rhonchi.  HEART:  Rhythm was regular. The heart sounds were normal and there were  no murmurs or gallops.  ABDOMEN:  Soft without hepatosplenomegaly. The bowel sounds were normal.  EXTREMITIES:  Peripheral pulses were full and there was no peripheral  edema.   IMPRESSION:  1. Coronary artery disease status post multiple percutaneous      interventions as described above now stable.  2. Hyperlipidemia with low  HDL and high triglycerides.  3. Hypertension.  4. Borderline glucose intolerance.   RECOMMENDATIONS:  I think [name] is doing well. We decided not to  do a followup stress test on him. He will plan to have his lipids done  at his clinic in [place] in the near future and get [place] the results. I  will plan to see him back in followup in 6 months.    Roy Elvera Lennox Juanda Chance, MD, [facility]  Electronically Signed   BRB/MedQ  DD: 04/05/2007  DT: 04/06/2007  Job #: [id number]

## 2011-05-06 NOTE — Discharge Summary (Signed)
NAME:  [name], [name] NO.:  [id number]   MEDICAL RECORD NO.:  [id number]          PATIENT TYPE:  OIB   LOCATION:  6527                         FACILITY:  [facility]   PHYSICIAN:  Charlies Constable, M.D. [facility] DATE OF BIRTH:  07-01-1956   DATE OF ADMISSION:  06/12/2006  DATE OF DISCHARGE:  06/14/2006                           DISCHARGE SUMMARY - REFERRING   SUMMARY OF HISTORY:  Mr. [name] is a 55 year old male who was referred to  Dr. Dorethea Clan on June 09, 2006, by the [facility] clinic secondary to a 6-week  history of progressive exertional-related chest discomfort radiating up into  his neck, shoulder, arms.  With his history of hypertension, hyperlipidemia,  glucose intolerance, it was felt that he should undergo cardiac  catheterization for further evaluation.   LABORATORY DATA:  On June 26, H&H was 15.6 and 45.7, normal indices,  platelets 257, wbc's 7.5.  On June 27, hematology essentially was unchanged.  On June 26, sodium was 139, potassium 36, BUN 11, creatinine 0.9, glucose  108.  Prior to discharge on June 27, BUN and creatinine was 9 and 0.9,  glucose 124.  CK-MBs on June 26 x2 were within normal limits.  EKG on the  25th showed normal sinus rhythm, left axis deviation, early R-wave.  Subsequent EKGs were essentially unchanged.   HOSPITAL COURSE:  Mr. [name] underwent cardiac catheterization on June 12, 2006, by Dr. Gala Romney.  Findings were reviewed with Dr. Juanda Chance and stenting  was performed to the mid PDA, the ostial PDA, and the posterior lateral  branch.  Dr. Juanda Chance noted that it was a long difficult procedure but the  patient tolerated it well.  With his LAD lesion, staged intervention was  arranged.  Cardiac rehab assisted with ambulation and education.  On June 13, 2006, Dr. Juanda Chance performed PTCA of the diagonal and stenting to the LAD.  By June 27 the patient was ambulating without difficulty, catheterization  site was intact, and it was felt that he could  be discharged home.   DISCHARGE DIAGNOSES:  1.  Acute coronary syndrome.  2.  Coronary artery disease with stenting to the mid posterior descending      coronary artery, ostial posterior descending coronary artery, and      posterolateral artery; angioplasty to the diagonal and stenting to the      left anterior descending coronary artery.  3.  Hyperglycemia with a history of glucose intolerance.  4.  History of hypertension and hyperlipidemia   DISPOSITION:  Mr. [name] is discharged home.  He is asked to maintain low  salt/fat/cholesterol diet.  Activity and wound care per the supplemental  discharge sheet.  He was asked to bring all medications to all appointments.  He received a new prescription for sublingual nitroglycerin 0.4 as needed.  He received new prescriptions for Plavix 75 mg daily and Coreg CR 20 mg  daily.  He was instructed that at the time of discharge he may proceed to  the [place] office to obtain a 7-day sample of Coreg CR 20 mg.  It is  noted that we did not have any samples  of Plavix.  He also received a free  Coreg CR card for a 30-day supply.  He was asked to continue his other  medications which include:  1.  Prinivil 20 mg daily.  2.  Gemfibrozil 1200 mg daily.  3.  Aspirin 325 daily.  4.  Fish oil 3000 mg daily.  5.  Vitamin E as previously.   He will follow up with Dr. Dorethea Clan in [place] on July 05, 2006, at 1:30  p.m.  He was asked to arrange followup appointment with the [facility] to follow up on his hyperglycemia.   Discharge time less than 30 minutes.      Joellyn Rued, P.A. [facility]    ______________________________  Charlies Constable, M.D. [facility]    EW/MEDQ  D:  06/14/2006  T:  06/14/2006  Job:  [id number]   cc:   Vida Roller, M.D.  Fax: [phone]   Dr. [facility] in [place], [place]

## 2011-05-06 NOTE — Cardiovascular Report (Signed)
NAME:  [name], [name] NO.:  [id number]   MEDICAL RECORD NO.:  [id number]          PATIENT TYPE:  OIB   LOCATION:  6527                         FACILITY:  [facility]   PHYSICIAN:  Charlies Constable, M.D. [facility] DATE OF BIRTH:  1956/10/31   DATE OF PROCEDURE:  06/13/2006  DATE OF DISCHARGE:                              CARDIAC CATHETERIZATION   PERCUTANEOUS CORONARY INTERVENTION AND INTRAVASCULAR ULTRASOUND NOTE.   PRIMARY CARE PHYSICIAN:  [facility].  The cardiologist is  Vida Roller, M.D.   CLINICAL HISTORY:  [name] is 55 years old and recently has developed  exertional chest pain.  He was brought in yesterday as an outpatient, and  the diagnostic study was performed by Dr. Gala Romney, which showed severe  disease in the right coronary artery and heavily calcified disease in the  proximal LAD.  Yesterday, we performed stenting of the mid and ostial  portions of the posterior descending branch of the right coronary artery and  stenting of the posterolateral branch of the right coronary artery.  We  brought him back today for IVUS and possible intervention on the LAD.   PROCEDURE:  The procedure was performed with a right femoral artery arterial  sheath and a Q4, 6-French guiding catheter with side holes.  The patient was  given Angiomax bolus and infusion and had already been on Plavix.  We passed  a Prowater down the LAD without too much difficulty.  We attempted to pass  the Atlantis IVUS catheter down the lesion, but due to calcification and  tortuosity and possibly due to severity of the stenosis, we were unable to  pass the IVUS catheter across the lesion.  We decided to dilate the lesion  with a 2.25 x 20 mm Maverick, and we performed 3 inflations at 10  atmospheres for 30 seconds.  We then elected to treat the lesion in the  diagonal branch of the LAD.  We passed a PT-2 light support wire down the  diagonal branch with some difficulty.  It took off  at a fairly sharp angle  from the LAD.  We initially passed a 2.25 x 12 mm Maverick and performed 2  inflations up to 10 atmospheres for 30 seconds.  We then attempted to pass a  2.5 x 12 Taxus and a 2.5 x 12 Mini Vision but were able to pass these stents  to the lesion due to tortuosity and calcification.  We elected not to use a  buddy wire and elected to settle for a balloon result and finished with a  2.5 x 50 mm Maverick and performed 1 long inflation, up to 8 atmospheres for  30 seconds.   We then re-approached the LAD.  We went in with a 2.5 x 20 mm Quantum  Maverick and performed several inflations, up to 20 atmospheres for 30  seconds, before we got full expansion of the balloon.  We then were able to  pass the IVUS catheter down the vessel.  This demonstrated marked  calcification, although most of this was 180 degrees or less.  Just proximal  to the tightest lesion,  there was 360 degrees of calcium with the lumen  having a diameter of about 2.75.  Based on these IVUS measurements, we  elected to place a stent just distal to the 360 degrees of calcium, which  was just proximal to the septal perforators and diagonal branch.  We chose a  2.75 x 20 mm Taxus, and we positioned the stent and then deployed it with 1  inflation of 16 atmospheres for 30 seconds.  We post dilated with a 3.0 x 15  mm Quantum Maverick, performing 2 inflations up to 18 atmospheres for 30  seconds.   Final diagnostic study was then performed through the guiding catheter.  Patient tolerated the procedure well and left the laboratory in satisfactory  condition.   RESULTS:  Initially, stenosis in the diagonal branch was estimated at 80%.  Following PTCA, this improved to 20%.   Initially, the stenosis in the LAD was difficult to identify due to  tortuosity and calcification in the vessel, but we estimate it was probably  80%.  Following stenting, this appeared to be 0%.  We did pinch the ostium  of the  diagonal branch, and the residual lumen was about 60% narrowed.   CONCLUSION:  1.  Successful stenting of the lesion in the proximal left anterior      descending coronary artery using a Taxus drug-eluting stent with      improvement in the narrowing from 80% to 20%.  2.  Successful percutaneous transluminal coronary angioplasty of a diagonal      branch of the left anterior descending coronary artery, with improvement      in the narrowing from 80% to 20%.   DISPOSITION:  Patient returned to her room for further observation.           ______________________________  Charlies Constable, M.D. [facility]     BB/MEDQ  D:  06/13/2006  T:  06/13/2006  Job:  [id number]   cc:   [facility]  Fax: [phone]   Vida Roller, M.D.  Fax: [phone]

## 2011-05-06 NOTE — Cardiovascular Report (Signed)
NAME:  [name], [name] NO.:  [id number]   MEDICAL RECORD NO.:  [id number]          PATIENT TYPE:  OIB   LOCATION:  6526                         FACILITY:  [facility]   PHYSICIAN:  Arturo Morton. Riley Kill, MD, FACCDATE OF BIRTH:  Oct 14, 1956   DATE OF PROCEDURE:  08/29/2006  DATE OF DISCHARGE:  08/30/2006                              CARDIAC CATHETERIZATION   INDICATIONS:  [name] is a 55 year old gentleman who presents with  recurrent pain radiating up into the shoulders and back.  He previously  underwent PTCA of the diagonal, stenting of the LAD, stenting of the  posterolateral segment and stenting of the PDA. This was done in late July.  He has had almost continuous symptoms ever since.  He was set up for  diagnostic catheterization.   PROCEDURE:  1. Left heart catheterization.  2. Selective coronary arteriography.  3. Selective left ventriculography.  4. Percutaneous intervention of the posterior descending ostium.   DESCRIPTION OF PROCEDURE:  The patient was brought to the catheterization  laboratory, prepped and draped in the usual fashion. Through an anterior  puncture in the right femoral artery, it was easily entered.  A 6-French  sheath was then placed.  We then took views of the left and right coronary  arteries.  Central aortic and left ventricular pressures were measured with  a pigtail.  Ventriculography was performed in the RAO projection.  The  patient had evidence of a high-grade ostial stenosis of the PDA just in  front of the stent site.  We discussed the various options with him and I  also went out and spoke with the patient's wife.  We elected to recommend  the percutaneous intervention.  Following this, preparations were made,  bivalirudin was given according to protocol and ACT checked and found to be  appropriate for PCI. A JR-4 guiding catheter with side holes was utilized,  the traverse wire was used across the PDA.  We initially crossed  with a 2.5-  mm balloon.  Balloon dilatation was done but there was evidence of some  early elastic recoil.  Following this, we elected to place a 2.5 x 6 cutting  balloon across the lesion and this was actually taken in several inflations  performed up to 7 atmospheres.  There was some slight improvement but not  dramatic improvement.  Following this, we subsequently placed 2.75 and 3.0  Quantum Maverick balloons and these were taken up to high pressure.  There  was a fair amount of improvement in the vessel.  We considered stenting the  artery, but unfortunately this would result in compromise of the  continuation branch.  Therefore, the procedure was completed, the femoral  sheath was sewn into place and all catheters were removed.  I spoke with the  patient's family.   HEMODYNAMIC DATA:  1. Central aortic pressure:  161/100, mean 127.  2. Left ventricular pressure:  159/21.  3. No gradient on pullback across the aortic valve.   ANGIOGRAPHIC DATA.:  1. Ventriculography in the RAO projection reveals inferobasal hypokinesis.  2. The right coronary artery is a large-caliber vessel with some  mild      luminal irregularities throughout the proximal and mid vessel and      calcification.  There is 95% narrowing at the proximal portion of the      posterior descending vessel over about a 2 mm length. Just distal to      this is a previously placed overlapping 2.25-mm stents and there is      some partial renarrowing in the stents of about 40%.  There are several      small posterolateral branches in the continuation branch which has been      previously stented and the drug-eluting stent appears to be widely      patent with excellent runoff.  3. The circumflex, left main and left anterior descending artery are      heavily calcified. The left main is without critical disease.  4. The LAD has about 40% narrowing proximal to a stent. The stent does not      have significant restenosis.   There is some diffuse luminal      irregularity distally. There is a first diagonal branch, and then a      second diagonal branch which is fairly large in caliber and has diffuse      70% proximal narrowing and then 50% narrowing at the previous site of      mid vessel PTCA.  5. The circumflex consists of two large marginal branches and there is 40%      diffuse irregularity in the AV continuation branch.   CONCLUSION:  1. Continued patency of the stent to the left anterior descending artery.  2. Mild renarrowing in the diagonal branch at the site of PTCA.  3. Continued patency of the posterolateral artery.  4. Partial renarrowing of the PDA stent with high-grade stenosis proximal      to the stent with successful balloon dilatation.   DISPOSITION:  We will try to treat the patient medically. The ostium of the  PDA is not an optimal spot for a stent as it will likely compromise the  posterolateral or continuation branch.  Hopefully balloon dilatation will  result in a lasting result.  He will need close follow-up with Dr. Daleen [name].      Arturo Morton. Riley Kill, MD, [facility]  Electronically Signed     TDS/MEDQ  D:  08/29/2006  T:  08/30/2006  Job:  [id number]   cc:   Arturo Morton. Riley Kill, MD, [facility]  Jesse Sans. Wall, MD, [facility]

## 2011-05-06 NOTE — Procedures (Signed)
NAME:  [name], INGALSBE            ACCOUNT NO.:  [id number]   MEDICAL RECORD NO.:  [id number]          PATIENT TYPE:  OUT   LOCATION:  SLEE                          FACILITY:  [facility]   PHYSICIAN:  Kofi A. Gerilyn Pilgrim, M.D. DATE OF BIRTH:  [birth year]   DATE OF PROCEDURE:  07/20/2008  DATE OF DISCHARGE:  07/15/2008                             SLEEP DISORDER REPORT   INDICATION:  A 55 year old man who presents with snoring and insomnia.  This study is being evaluated for apnea.  Epworth sleepiness scale 5.  BMI is 33.   MEDICATIONS:  Coreg, Plavix, TriCor, Crestor, lisinopril, aspirin, and  nitroglycerin.   SLEEP STAGE SUMMARY:  This is a split night recording with the first  part being diagnostic and the second part a titration.  The total  recording time of the diagnostic is 165 minutes and 237 in the titration  portion.  The sleep latency is 33 minutes and the REM latency is 116  minutes.   RESPIRATORY SUMMARY:  The baseline oxygen saturation is 96% with the  lowest oxygen saturation 89%.  Most of the respiratory events during the  diagnostic or central apneas, although there are some obstructive  hypopneas observed.  The patient was titrated between pressures of 6 and  13 with an optimal pressure of 9 and higher pressure is induced even  more central events.   LIMB MOVEMENT SUMMARY:  PLM index 0.   ELECTROCARDIOGRAM SUMMARY:  Average heart rate is 61 with no arrhythmias  observed.   IMPRESSION:  Complex sleep disordered breathing with more central apneas  observed than obstructive apneas.  However, the patient responded well  to a CPAP of 9.      Kofi A. Gerilyn Pilgrim, M.D.  Electronically Signed     KAD/MEDQ  D:  07/20/2008  T:  07/21/2008  Job:  [id number]

## 2011-05-06 NOTE — Assessment & Plan Note (Signed)
[facility]                            CARDIOLOGY OFFICE NOTE   NAME:[name], [name]                   MRN:          [id number]  DATE:11/20/2006                            DOB:          11/01/1956    PRIMARY CARE PHYSICIAN:  Dr. Colon Branch, [facility] [place], [place].   CLINICAL COURSE:  [name] is 55 years old and works both as a  Programmer, multimedia and a Engineer, materials at a private school in [place]. In June of  this year, he developed exertional chest pain and was found to have  severe obstructive disease and underwent placement of drug-eluting  stents at the ostium and mid portion of the posterior descending branch  of the right coronary artery, posterolateral branch of the right  coronary artery and the proximal LAD. He developed recurrent symptoms  and in September had cutting balloon angioplasty of a persistent lesion  at the ostium of the right coronary artery.   He has done quite well since that time, has had no exertional chest  pain. He does get some shoulder pain when he sits at his computer but he  never gets anything similar to this with activity. He has had no  palpitations and no shortness of breath.   PAST MEDICAL HISTORY:  Significant for hypertension, hyperlipidemia, and  borderline glucose intolerance.   CURRENT MEDICATIONS:  Coreg, Plavix, Prinivil, aspirin, fish oil, Tricor  and Crestor.   PHYSICAL EXAMINATION:  VITAL SIGNS:  Blood pressure is 144/98, pulse 66  and regular.  NECK:  There was no vein extension. The carotid pulses were full and  there were no bruits.  CHEST:  Clear without rales or rhonchi.  CARDIAC:  Rhythm was regular. Heart sounds were normal and there were no  murmurs or gallops.  ABDOMEN:  Soft without organomegaly.  EXTREMITIES:  Peripheral pulses were full. There was no peripheral  edema.   IMPRESSION:  1. Coronary artery disease status post multiple percutaneous      interventions as described above,  the last of which was cutting      balloon angioplasty of her ostial persistent restenosis posterior      descending branch of the right coronary artery September 2007,      stable.  2. Hyperlipidemia with high triglycerides and low HDL with followup      labs pending.  3. Hypertension under optimal control.  4. Borderline glucose intolerance.   RECOMMENDATIONS:  I think [name] is doing well from the standpoint  of his heart. His blood pressure is up and will increase his Prinvil  from 20 to 40 a day. Will get the results of his lipid profile from  [name].   ADDENDUM:  We will plan to see [name] back in 4 months. He asked  about a stress test and I told him we would consider that at 4 months,  but since he is self pay, will take that into  consideration. I encouraged him to follow a low glycemic diet as well as  a diet that is low in saturated and transfats.     Bruce Elvera Lennox Juanda Chance,  MD, [facility]  Electronically Signed    BRB/MedQ  DD: 11/20/2006  DT: 11/20/2006  Job #: [id number]

## 2011-05-06 NOTE — Assessment & Plan Note (Signed)
[facility]                         Lane CARDIOLOGY OFFICE NOTE   NAME:[name], [name]                   MRN:          [id number]  DATE:08/25/2006                            DOB:          [birth year]    [name] comes in today because of recurrent ischemic symptoms.  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  He was seen initially on June 09, 2006, by Dr. Dorethea Clan for  exertional angina.  Remarkably, it has been across his shoulders usually and  not in his chest.   He underwent cardiac catheterization and ended up with three drug-eluting  stents, two to his right, one to his LAD.  He had a PCI of the diagonal  branch of the LAD without stenting and then a bare metal stent to the mid  portion of his right coronary artery.  His overall LV function was normal.  He had a lot of diffuse disease, obviously.   He has done well.  He has been plagued almost since the procedure itself  with bi trapezial burning instead of the aching he had before.  It comes on  at rest and with exertion.  He sometimes aches a little bit in his left arm  but is not consistent with exertion.  He is exercising more than he ever  has.   He does not use nitroglycerin but was instructed today by me to do so.   PAST MEDICAL HISTORY:   CURRENT MEDICATIONS:  1. Plavix 75 mg a day.  2. Coreg SR 20 mg a day.  3. Prinivil 20 mg a day.  4. Gemfibrozil 1200 mg a day.  5. Fish oil 3,000 mg daily.  6. Vitamin E daily.  7. Aspirin 325 a day.   HE HAS NO KNOWN DRUG ALLERGIES.   RISK FACTORS:  1. Age.  2. Hypertension.  3. Hyperlipidemia.  4. Glucose intolerance.  He does not smoke, drink, or use illicit drugs.   PAST SURGICAL HISTORY:  Tonsillectomy when he was very young.   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   FAMILY HISTORY:  His mother had  coronary artery bypass surgery in her 43s.   REVIEW OF SYSTEMS:  He denies any headaches or dizziness.  He wears glasses  but has no other visual problems.  He has no problems with hearing.  He has  no problems with his teeth.  He has had no nausea, vomiting, diarrhea,  constipation.  He denies any hematemesis or hematochezia.  He has had no  melena.  There is no history of ulcer.  He has no history of any  genitourinary issues.  The rest of his review of systems is negative.   PHYSICAL EXAMINATION:  GENERAL:  He is very pleasant.  VITAL SIGNS:  His blood pressure is 112/80 in the right arm; 120/80 in the  left arm.  His pulse is 66 and regular.  His weight is 222, down 10 from his  last visit.  HEENT:  He wears glasses.  Normocephalic atraumatic.  Otherwise, PERRLA.  Extraocular movements intact.  Sclerae are clear.  Carotids are full without  bruits.  There is no JVD.  Thyroid is not enlarged.  LUNGS:  Clear.  HEART:  Reveals a regular rate and rhythm.  There is normal S2.  ABDOMEN:  Soft with good bowel sounds.  No hepatomegaly.  EXTREMITIES:  Reveal no cyanosis, clubbing, or edema.  Pulses are intact.  NEUROLOGIC:  Intact.   EKG today is completely normal except for a left axis deviation which is  stable.   ASSESSMENT/PLAN:  [name] most likely has some restenosis in one or  more of his intervention sites.  His symptoms are somewhat different in that  he is now having this burning as opposed to aching.  However, he does have  an occasional ache in his left arm.  It is kind of interesting that it is  not always with exertion.   He has not used nitroglycerin and I have gone through a strict protocol with  him.  I have also counseled him on how to call 911.   PLAN:  1. Outpatient cardiac catheterization, Tuesday, August 29, 2006.  2. Continue current medications.  3. Nitroglycerin.  4. Call 911 if persistent symptoms not responsive to nitroglycerin.                                    Thomas C. Daleen Squibb, MD, [facility]   TCW/MedQ  DD:  08/25/2006  DT:  08/25/2006  Job #:  [id number]   cc:   [facility] [place]

## 2011-06-10 IMAGING — NM NM MYOCAR SINGLE W/SPECT W/WALL MOTION & EF
1 series · 6 of 6 positions shown · non-contrast
Comparison: none

Ordering Physician: Malu Sams

Algarin Physician: [REDACTED]al Data: 54-year-old male with history of coronary artery
disease status post previous percutaneous interventions,
hypertension, and hyperlipidemia.  He is referred for the follow-up
assessment of ischemia.
NUCLEAR MEDICINE STRESS MYOVIEW STUDY WITH SPECT AND LEFT
VENTRICULAR EJECTION FRACTION
Radionuclide Data: One-day rest/stress protocol performed with
[DATE] mCi of 6c-XXm Myoview.
Stress Data: The patient was exercised on a standard Bruce protocol
for 8 minutes and 20 seconds achieving a maximum workload of
mets.  Heart rate increased from 63 beats per minute up to 150
beats per minute which was 90% of the age predicted maximum heart
rate response.  Blood pressure increased from 130/70 up to 140/84.
No chest pain was reported.  He did have shortness of breath with
exercise.  Lead motion artifact was noted.  No clearly diagnostic
ST-segment changes were noted by standard criteria.  Occasional to
frequent PVCs noted in recovery.
EKG: Resting ECG shows sinus rhythm at 68 beats per minute.
Scintigraphic Data: Analysis of the raw perfusion data shows
evidence of diaphragmatic attenuation.
Tomographic views were obtained using the short axis, vertical long
axis, and horizontal long axis planes.  There is a mild intensity
defect in the inferior wall, fixed, and greater at rest than
stress.  There is also a mild anteroseptal defect that is fixed,
mainly in the mid to apical region, and again worse at rest than
stress.  These are most suggestive of soft tissue attenuation
rather than scar, and there are no large reversible defects to
indicate ischemia.
Gated imaging reveals an EDV of 94, ESV of 39, T I D ratio of 0.79,
and LVEF of 58%.

[Series 1: cs cardiac tc hi dose · 6.41mm/px · 6 of 512 frames shown]
[frame 43/512]
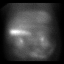
[frame 128/512]
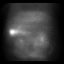
[frame 214/512]
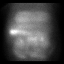
[frame 299/512]
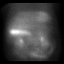
[frame 384/512]
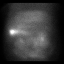
[frame 470/512]
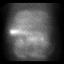

[6 of 6 positions shown; findings below may reference images not displayed]

IMPRESSION: Low risk exercise Myoview.  No clearly diagnostic ST-segment
changes are noted.  No chest pain was reported.  Occasional to
frequent PVCs noted only in recovery.  No sustained arrhythmias.
Perfusion imaging shows evidence of probable soft tissue
attenuation affecting the inferior and anteroseptal regions as
noted above.  No clear evidence of ischemia.  LVEF is 58%.

## 2011-06-10 IMAGING — NM NM MYOCAR SINGLE W/SPECT W/WALL MOTION & EF
1 series · 6 of 6 positions shown · non-contrast
Comparison: none

Ordering Physician: Malu Sams

Algarin Physician: [REDACTED]al Data: 54-year-old male with history of coronary artery
disease status post previous percutaneous interventions,
hypertension, and hyperlipidemia.  He is referred for the follow-up
assessment of ischemia.
NUCLEAR MEDICINE STRESS MYOVIEW STUDY WITH SPECT AND LEFT
VENTRICULAR EJECTION FRACTION
Radionuclide Data: One-day rest/stress protocol performed with
[DATE] mCi of 6c-XXm Myoview.
Stress Data: The patient was exercised on a standard Bruce protocol
for 8 minutes and 20 seconds achieving a maximum workload of
mets.  Heart rate increased from 63 beats per minute up to 150
beats per minute which was 90% of the age predicted maximum heart
rate response.  Blood pressure increased from 130/70 up to 140/84.
No chest pain was reported.  He did have shortness of breath with
exercise.  Lead motion artifact was noted.  No clearly diagnostic
ST-segment changes were noted by standard criteria.  Occasional to
frequent PVCs noted in recovery.
EKG: Resting ECG shows sinus rhythm at 68 beats per minute.
Scintigraphic Data: Analysis of the raw perfusion data shows
evidence of diaphragmatic attenuation.
Tomographic views were obtained using the short axis, vertical long
axis, and horizontal long axis planes.  There is a mild intensity
defect in the inferior wall, fixed, and greater at rest than
stress.  There is also a mild anteroseptal defect that is fixed,
mainly in the mid to apical region, and again worse at rest than
stress.  These are most suggestive of soft tissue attenuation
rather than scar, and there are no large reversible defects to
indicate ischemia.
Gated imaging reveals an EDV of 94, ESV of 39, T I D ratio of 0.79,
and LVEF of 58%.

[cr cardiac tc low dose · 6.41mm/px · 6 of 64 frames shown]
[frame 6/64]
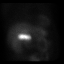
[frame 16/64]
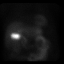
[frame 27/64]
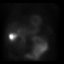
[frame 38/64]
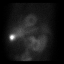
[frame 48/64]
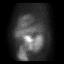
[frame 59/64]
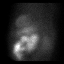

[6 of 6 positions shown; findings below may reference images not displayed]

IMPRESSION: Low risk exercise Myoview.  No clearly diagnostic ST-segment
changes are noted.  No chest pain was reported.  Occasional to
frequent PVCs noted only in recovery.  No sustained arrhythmias.
Perfusion imaging shows evidence of probable soft tissue
attenuation affecting the inferior and anteroseptal regions as
noted above.  No clear evidence of ischemia.  LVEF is 58%.

## 2011-11-30 ENCOUNTER — Encounter: Payer: Self-pay | Admitting: Cardiology

## 2012-07-24 NOTE — Patient Instructions (Addendum)
Your physician recommends that you schedule a follow-up appointment in: 1 year  

## 2012-07-25 ENCOUNTER — Encounter: Payer: Self-pay | Admitting: Cardiology

## 2012-07-25 ENCOUNTER — Ambulatory Visit (INDEPENDENT_AMBULATORY_CARE_PROVIDER_SITE_OTHER): Payer: Self-pay | Admitting: Cardiology

## 2012-07-25 VITALS — BP 120/80 | HR 61 | Wt 234.0 lb

## 2012-07-25 DIAGNOSIS — I10 Essential (primary) hypertension: Secondary | ICD-10-CM

## 2012-07-25 DIAGNOSIS — E785 Hyperlipidemia, unspecified: Secondary | ICD-10-CM

## 2012-07-25 DIAGNOSIS — I2510 Atherosclerotic heart disease of native coronary artery without angina pectoris: Secondary | ICD-10-CM

## 2012-07-25 NOTE — Assessment & Plan Note (Signed)
Blood pressure control is good today. 

## 2012-07-25 NOTE — Assessment & Plan Note (Signed)
Symptomatically stable on medical therapy. ECG from February reviewed as well as Myoview from last year. We discussed a walking regimen for exercise. Continue medical therapy. Annual followup arranged.

## 2012-07-25 NOTE — Progress Notes (Signed)
Clinical Summary [name] is a 56 y.o.male presenting for followup. He was seen in February of 2012. Continues to do well, no progressive angina or dyspnea. He reports compliance with his medications. No bleeding problems on DAPT. No palpitations or syncope.  Exercise Myoview from March 2012 showed no diagnostic ST segment changes, occasional to frequent PVCs in recovery, soft tissue attenuation without definite ischemia, LVEF 58%.  ECG reviewed from February showed sinus rhythm with PVC.Labwork showed Hgb 14.6, platelets 262, potassium 4.1, BUN 17, creatinine 0.9, AST 29, ALT 29, cholesterol 117, triglycerides 135, HDL 28, LDL 62, HgbA1C 5.9.   No Known Allergies  Current Outpatient Prescriptions  Medication Sig Dispense Refill  . aspirin 325 MG tablet Take 325 mg by mouth daily.        . carvedilol (COREG) 12.5 MG tablet Take 12.5 mg by mouth 2 (two) times daily with a meal.        . Choline Fenofibrate 135 MG capsule Take 135 mg by mouth daily.      . clopidogrel (PLAVIX) 75 MG tablet Take 75 mg by mouth daily.        Marland Kitchen dexlansoprazole (DEXILANT) 60 MG capsule Take 60 mg by mouth daily.        . fish oil-omega-3 fatty acids 1000 MG capsule Take 1 capsule by mouth 4 (four) times daily.        Marland Kitchen lisinopril (PRINIVIL,ZESTRIL) 40 MG tablet Take 40 mg by mouth daily.        . Multiple Vitamin (MULTIVITAMIN) tablet Take 1 tablet by mouth daily.        . nitroGLYCERIN (NITROSTAT) 0.4 MG SL tablet Place 0.4 mg under the tongue every 5 (five) minutes as needed. May repeat for up to 3 doses.       Marland Kitchen quinapril-hydrochlorothiazide (ACCURETIC) 20-12.5 MG per tablet Take 2 tablets by mouth daily.      . rosuvastatin (CRESTOR) 10 MG tablet Take 10 mg by mouth daily.          Past Medical History  Diagnosis Date  . Hypertension   . Hyperlipidemia   . Coronary artery disease     BMS mid PDA, DES ostial PDA, DES PLA 2007, DES prox LAD, PTCA diagnoal, 6/07; PTCA PDA 9/07    Social  History [name] has an unknown smoking status. He does not have any smokeless tobacco history on file. [name] reports that he does not drink alcohol.  Review of Systems Negative except as outlined above.  Physical Examination Filed Vitals:   07/25/12 0831  BP: 120/80  Pulse: 61    Obese male in no acute distress.  HEENT: Conjunctiva and lids normal, oropharynx with moist mucosa.  Neck: Supple, no elevated JVP or carotid bruits, no thyromegaly.  Lungs: Clear to auscultation, nontender.  Cardiac: Regular rate and rhythm, no S3.  Abdomen: Soft, nontender, bowel sounds present.  Skin: Warm and dry.  Extremities: No pitting edema, distal pulses full.  Musculoskeletal: No kyphosis.  Neuropsychiatric: Alert and oriented x3, affect appropriate.    Problem List and Plan   CAD, NATIVE VESSEL Symptomatically stable on medical therapy. ECG from February reviewed as well as Myoview from last year. We discussed a walking regimen for exercise. Continue medical therapy. Annual followup arranged.  HYPERTENSION, BENIGN Blood pressure control is good today.  HYPERLIPIDEMIA-MIXED Lipids also look good, LDL 62, triglycerides 135. HDL is low - should increase execise. Wonder if he really needs to stay on both Crestor and  Trilipix. Might consider Crestor alone and then reassess lipids. If elevated triglycerides become a problem, could use omega-3 suuplements.    Jonelle Sidle, M.D., F.A.C.C.

## 2012-07-25 NOTE — Assessment & Plan Note (Signed)
Lipids also look good, LDL 62, triglycerides 135. HDL is low - should increase execise. Wonder if he really needs to stay on both Crestor and Trilipix. Might consider Crestor alone and then reassess lipids. If elevated triglycerides become a problem, could use omega-3 suuplements.

## 2013-03-18 ENCOUNTER — Encounter: Payer: Self-pay | Admitting: Cardiology

## 2013-06-18 ENCOUNTER — Encounter: Payer: Self-pay | Admitting: Cardiology

## 2013-08-16 ENCOUNTER — Ambulatory Visit: Payer: Self-pay | Admitting: Cardiology

## 2013-08-26 ENCOUNTER — Ambulatory Visit (INDEPENDENT_AMBULATORY_CARE_PROVIDER_SITE_OTHER): Payer: Self-pay | Admitting: Cardiology

## 2013-08-26 ENCOUNTER — Encounter: Payer: Self-pay | Admitting: Cardiology

## 2013-08-26 VITALS — BP 119/81 | HR 63 | Ht 69.0 in | Wt 233.2 lb

## 2013-08-26 DIAGNOSIS — E785 Hyperlipidemia, unspecified: Secondary | ICD-10-CM

## 2013-08-26 DIAGNOSIS — I10 Essential (primary) hypertension: Secondary | ICD-10-CM

## 2013-08-26 DIAGNOSIS — I2510 Atherosclerotic heart disease of native coronary artery without angina pectoris: Secondary | ICD-10-CM

## 2013-08-26 NOTE — Assessment & Plan Note (Signed)
Blood pressure is normal today. 

## 2013-08-26 NOTE — Assessment & Plan Note (Signed)
Keep follow up with primary care. LDL 64 in June.

## 2013-08-26 NOTE — Assessment & Plan Note (Signed)
Symptomatically stable on medical therapy. ECG reviewed. No changes made. We will continue DAPT long-term as patient is status post several stent interventions over time, both BMS and DES.

## 2013-08-26 NOTE — Progress Notes (Signed)
Clinical Summary [name] is a 57 y.o.male last seen in June 2013.  He reports no progressive angina symptoms, stable dyspnea on exertion, NYHA class II. Walks with his wife for exercise. Reports compliance with his medications.  Lab work from June 2014 check hemoglobin 14.2, platelets 247, potassium 3.5, BUN 16, creatinine 0.9, normal AST and ALT, cholesterol 121, triglycerides 166, HDL 24, LDL 64, hemoglobin A1c 5.9.  Exercise Myoview from March 2012 showed no diagnostic ST segment changes, occasional to frequent PVCs in recovery, soft tissue attenuation without definite ischemia, LVEF 58%. ECG today shows sinus rhythm with low voltage precordial leads.   No Known Allergies  Current Outpatient Prescriptions  Medication Sig Dispense Refill  . aspirin 325 MG tablet Take 325 mg by mouth daily.        . carvedilol (COREG) 12.5 MG tablet Take 12.5 mg by mouth 2 (two) times daily with a meal.        . Choline Fenofibrate 135 MG capsule Take 135 mg by mouth daily.      . clopidogrel (PLAVIX) 75 MG tablet Take 75 mg by mouth daily.        Marland Kitchen dexlansoprazole (DEXILANT) 60 MG capsule Take 60 mg by mouth daily.        . fish oil-omega-3 fatty acids 1000 MG capsule Take 1 capsule by mouth 4 (four) times daily.        . Multiple Vitamin (MULTIVITAMIN) tablet Take 1 tablet by mouth daily.        . nitroGLYCERIN (NITROSTAT) 0.4 MG SL tablet Place 0.4 mg under the tongue every 5 (five) minutes as needed. May repeat for up to 3 doses.       Marland Kitchen quinapril-hydrochlorothiazide (ACCURETIC) 20-12.5 MG per tablet Take 2 tablets by mouth daily.      . rosuvastatin (CRESTOR) 10 MG tablet Take 10 mg by mouth daily.         No current facility-administered medications for this visit.    Past Medical History  Diagnosis Date  . Hypertension   . Hyperlipidemia   . Coronary artery disease     BMS mid PDA, DES ostial PDA, DES PLA 2007, DES prox LAD, PTCA diagnoal, 6/07; PTCA PDA 9/07    Social History [name] reports that he has never smoked. He does not have any smokeless tobacco history on file. [name] reports that he does not drink alcohol.  Review of Systems No palpitations or dizziness. No reported bleeding episodes. Stable appetite. No claudication. Otherwise negative.  Physical Examination Filed Vitals:   08/26/13 0822  BP: 119/81  Pulse: 63   Filed Weights   08/26/13 0822  Weight: 233 lb 4 oz (105.802 kg)    Obese male in no acute distress.  HEENT: Conjunctiva and lids normal, oropharynx with moist mucosa.  Neck: Supple, no elevated JVP or carotid bruits, no thyromegaly.  Lungs: Clear to auscultation, nontender.  Cardiac: Regular rate and rhythm, no S3.  Abdomen: Soft, nontender, bowel sounds present.  Skin: Warm and dry.  Extremities: No pitting edema, distal pulses full.  Musculoskeletal: No kyphosis.  Neuropsychiatric: Alert and oriented x3, affect appropriate.   Problem List and Plan   CAD, NATIVE VESSEL Symptomatically stable on medical therapy. ECG reviewed. No changes made. We will continue DAPT long-term as patient is status post several stent interventions over time, both BMS and DES.  HYPERLIPIDEMIA-MIXED Keep follow up with primary care. LDL 64 in June.  HYPERTENSION, BENIGN Blood pressure is normal  today.    Jonelle Sidle, M.D., F.A.C.C.

## 2013-08-26 NOTE — Patient Instructions (Addendum)
Your physician recommends that you schedule a follow-up appointment in: ONE YEAR 

## 2014-05-06 ENCOUNTER — Other Ambulatory Visit (HOSPITAL_COMMUNITY): Payer: Self-pay

## 2014-05-06 DIAGNOSIS — R0602 Shortness of breath: Secondary | ICD-10-CM

## 2014-05-13 ENCOUNTER — Ambulatory Visit (HOSPITAL_COMMUNITY)
Admission: RE | Admit: 2014-05-13 | Discharge: 2014-05-13 | Disposition: A | Discharge status: Home / Self Care | Payer: 59 | Source: Ambulatory Visit | Attending: Physician Assistant | Admitting: Physician Assistant

## 2014-05-13 DIAGNOSIS — R0602 Shortness of breath: Secondary | ICD-10-CM | POA: Insufficient documentation

## 2014-05-13 MED ORDER — ALBUTEROL SULFATE (2.5 MG/3ML) 0.083% IN NEBU
2.5000 mg | INHALATION_SOLUTION | Freq: Once | RESPIRATORY_TRACT | Status: AC
  Administered 2014-05-13: 2.5 mg via RESPIRATORY_TRACT

## 2014-05-21 LAB — PULMONARY FUNCTION TEST
DL/VA % PRED: 97 %
DL/VA: 4.44 ml/min/mmHg/L
DLCO COR: 27.37 ml/min/mmHg
DLCO cor % pred: 88 %
DLCO unc % pred: 88 %
DLCO unc: 27.37 ml/min/mmHg
FEF 25-75 POST: 3.43 L/s
FEF 25-75 PRE: 3.35 L/s
FEF2575-%CHANGE-POST: 2 %
FEF2575-%Pred-Post: 115 %
FEF2575-%Pred-Pre: 113 %
FEV1-%CHANGE-POST: 0 %
FEV1-%PRED-POST: 107 %
FEV1-%PRED-PRE: 106 %
FEV1-POST: 3.79 L
FEV1-PRE: 3.77 L
FEV1FVC-%CHANGE-POST: 2 %
FEV1FVC-%Pred-Pre: 100 %
FEV6-%Change-Post: 0 %
FEV6-%Pred-Post: 109 %
FEV6-%Pred-Pre: 110 %
FEV6-PRE: 4.89 L
FEV6-Post: 4.84 L
FEV6FVC-%Change-Post: 0 %
FEV6FVC-%PRED-POST: 105 %
FEV6FVC-%Pred-Pre: 104 %
FVC-%CHANGE-POST: -1 %
FVC-%PRED-PRE: 106 %
FVC-%Pred-Post: 104 %
FVC-POST: 4.84 L
FVC-PRE: 4.93 L
PRE FEV1/FVC RATIO: 76 %
Post FEV1/FVC ratio: 78 %
Post FEV6/FVC ratio: 100 %
Pre FEV6/FVC Ratio: 99 %
RV % PRED: 100 %
RV: 2.15 L
TLC % PRED: 95 %
TLC: 6.49 L

## 2014-08-28 ENCOUNTER — Encounter: Payer: Self-pay | Admitting: Adult Health

## 2014-08-28 ENCOUNTER — Ambulatory Visit (INDEPENDENT_AMBULATORY_CARE_PROVIDER_SITE_OTHER): Payer: 59 | Admitting: Adult Health

## 2014-08-28 VITALS — BP 102/72 | HR 95 | Ht 69.0 in | Wt 235.0 lb

## 2014-08-28 DIAGNOSIS — E785 Hyperlipidemia, unspecified: Secondary | ICD-10-CM

## 2014-08-28 DIAGNOSIS — I10 Essential (primary) hypertension: Secondary | ICD-10-CM

## 2014-08-28 DIAGNOSIS — I2510 Atherosclerotic heart disease of native coronary artery without angina pectoris: Secondary | ICD-10-CM

## 2014-08-28 MED ORDER — CLOPIDOGREL BISULFATE 75 MG PO TABS: 75.0000 mg | ORAL_TABLET | Freq: Every day | ORAL | Status: AC

## 2014-08-28 MED ORDER — QUINAPRIL-HYDROCHLOROTHIAZIDE 20-12.5 MG PO TABS: 2.0000 | ORAL_TABLET | Freq: Every day | ORAL | Status: AC

## 2014-08-28 MED ORDER — ROSUVASTATIN CALCIUM 10 MG PO TABS: 10.0000 mg | ORAL_TABLET | Freq: Every day | ORAL | Status: AC

## 2014-08-28 MED ORDER — CARVEDILOL 12.5 MG PO TABS: 12.5000 mg | ORAL_TABLET | Freq: Two times a day (BID) | ORAL | Status: DC

## 2014-08-28 NOTE — Assessment & Plan Note (Signed)
Excellent control of blood pressure. He is medically compliant. He is advised to increase his exercise and remain on a low sodium diet.

## 2014-08-28 NOTE — Progress Notes (Signed)
HPI: [name] 58 year-old male patient of Dr. Diona Browner, we are following  for ongoing assessment and management of CAD, bare-metal stents to the PDA, drug-eluting stent. The ostial PDA, drug-eluting stent to the PLA, stent in the proximal LAD, and PTCA to the diagonal, and PDA. in 2007; stable [place] Heart Association class II dyspnea.  The patient had an exercise Myoview in March of 2012 showing no diagnostic ST segment changes, occasional drink, soft tissue attenuation without definite ischemia. LV EF 58%. He was last seen in the office by Dr. Diona Browner in September 2014. He was stable.  He comes today without any complaints. He has not been seen in the emergency room, been  hospitalized, had any new diagnoses or medication changes since being seen last. He denies any complaints of chest pain, shortness of breath, weakness, or fatigue.  He is living to [place], [place] in 2 days. He will be establish with a cardiologist there and a new primary care physician.    No Known Allergies  Current Outpatient Prescriptions  Medication Sig Dispense Refill  . aspirin 325 MG tablet Take 325 mg by mouth daily.        . carvedilol (COREG) 12.5 MG tablet Take 1 tablet (12.5 mg total) by mouth 2 (two) times daily with a meal.  120 tablet  0  . Choline Fenofibrate 135 MG capsule Take 145 mg by mouth daily.       . clopidogrel (PLAVIX) 75 MG tablet Take 1 tablet (75 mg total) by mouth daily.  60 tablet  0  . dexlansoprazole (DEXILANT) 60 MG capsule Take 60 mg by mouth daily.        . fish oil-omega-3 fatty acids 1000 MG capsule Take 1 capsule by mouth 4 (four) times daily.        . Multiple Vitamin (MULTIVITAMIN) tablet Take 1 tablet by mouth daily.        . nitroGLYCERIN (NITROSTAT) 0.4 MG SL tablet Place 0.4 mg under the tongue every 5 (five) minutes as needed. May repeat for up to 3 doses.       Marland Kitchen quinapril-hydrochlorothiazide (ACCURETIC) 20-12.5 MG per tablet Take 2 tablets by mouth daily.  120 tablet   0  . rosuvastatin (CRESTOR) 10 MG tablet Take 1 tablet (10 mg total) by mouth daily.  60 tablet  0   No current facility-administered medications for this visit.    Past Medical History  Diagnosis Date  . Hypertension   . Hyperlipidemia   . Coronary artery disease     BMS mid PDA, DES ostial PDA, DES PLA 2007, DES prox LAD, PTCA diagnoal, 6/07; PTCA PDA 9/07    History reviewed. No pertinent past surgical history.  ROS:  Review of systems complete and found to be negative unless listed above  PHYSICAL EXAM BP 102/72  Pulse 95  Ht  (1.753 m)  Wt 235 lb (106.595 kg)  BMI 34.69 kg/m2  SpO2 98% General: Well developed, well nourished, in no acute distress Head: Eyes PERRLA, No xanthomas.   Normal cephalic and atramatic  Lungs: Clear bilaterally to auscultation and percussion. Heart: HRRR S1 S2, without MRG.  Pulses are 2+ & equal.            No carotid bruit. No JVD.  No abdominal bruits. No femoral bruits. Abdomen: Bowel sounds are positive, abdomen soft and non-tender without masses or  Hernia's noted. Msk:  Back normal, normal gait. Normal strength and tone for age. Extremities: No clubbing, cyanosis or edema.  DP +1 Neuro: Alert and oriented X 3. Psych:  Good affect, responds appropriately   EKG:  Normal sinus rhythm, rate of 72 beats per minute.  ASSESSMENT AND PLAN

## 2014-08-28 NOTE — Assessment & Plan Note (Signed)
The patient will have fasting lipids and LFTs completed in the a.m. He will come by afterwards and pickup records, which she will take with him to his new physician and cardiologist to be est. In the, interim, he will continue his Crestor. He wishes [place] to send new prescriptions to [facility] pharmacy in [place], [place]. We have provided him with 2 months of his cardiac medications with no refills after that. We will send copies of his lab work to his new address   [address] [place], [place] [address].

## 2014-08-28 NOTE — Progress Notes (Deleted)
Name: [name]    DOB: [birth year]  Age: 58 y.o.  MR#: [id number]       PCP:  No primary provider on file.      Insurance: Payor: Advertising copywriter / Plan: Advertising copywriter / Product Type: *No Product type* /   CC:    Chief Complaint  Patient presents with  . Chest Pain  . Coronary Artery Disease    VS Filed Vitals:   08/28/14 1404  BP: 102/72  Pulse: 95  Height:  (1.753 m)  Weight: 235 lb (106.595 kg)  SpO2: 98%    Weights Current Weight  08/28/14 235 lb (106.595 kg)  08/26/13 233 lb 4 oz (105.802 kg)  07/25/12 234 lb (106.142 kg)    Blood Pressure  BP Readings from Last 3 Encounters:  08/28/14 102/72  08/26/13 119/81  07/25/12 120/80     Admit date:  (Not on file) Last encounter with RMR:  Visit date not found   Allergy Review of patient's allergies indicates no known allergies.  Current Outpatient Prescriptions  Medication Sig Dispense Refill  . aspirin 325 MG tablet Take 325 mg by mouth daily.        . carvedilol (COREG) 12.5 MG tablet Take 12.5 mg by mouth 2 (two) times daily with a meal.        . Choline Fenofibrate 135 MG capsule Take 145 mg by mouth daily.       . clopidogrel (PLAVIX) 75 MG tablet Take 75 mg by mouth daily.        Marland Kitchen dexlansoprazole (DEXILANT) 60 MG capsule Take 60 mg by mouth daily.        . fish oil-omega-3 fatty acids 1000 MG capsule Take 1 capsule by mouth 4 (four) times daily.        . Multiple Vitamin (MULTIVITAMIN) tablet Take 1 tablet by mouth daily.        . nitroGLYCERIN (NITROSTAT) 0.4 MG SL tablet Place 0.4 mg under the tongue every 5 (five) minutes as needed. May repeat for up to 3 doses.       Marland Kitchen quinapril-hydrochlorothiazide (ACCURETIC) 20-12.5 MG per tablet Take 2 tablets by mouth daily.      . rosuvastatin (CRESTOR) 10 MG tablet Take 10 mg by mouth daily.         No current facility-administered medications for this visit.    Discontinued Meds:   There are no discontinued medications.  Patient Active  Problem List   Diagnosis Date Noted  . HYPERLIPIDEMIA-MIXED 12/21/2009  . CAD, NATIVE VESSEL 12/21/2009  . HYPERTENSION, BENIGN 11/26/2008    LABS    Component Value Date/Time   NA 140 12/17/2009 0000   K 4.0 12/17/2009 0000   CL 105 12/17/2009 0000   CO2 23 12/17/2009 0000   GLUCOSE 99 12/17/2009 0000   BUN 19 12/17/2009 0000   CREATININE 0.75 12/17/2009 0000   CALCIUM 9.1 12/17/2009 0000   CMP     Component Value Date/Time   NA 140 12/17/2009 0000   K 4.0 12/17/2009 0000   CL 105 12/17/2009 0000   CO2 23 12/17/2009 0000   GLUCOSE 99 12/17/2009 0000   BUN 19 12/17/2009 0000   CREATININE 0.75 12/17/2009 0000   CALCIUM 9.1 12/17/2009 0000   PROT 7.0 12/17/2009 0000   ALBUMIN 4.3 12/17/2009 0000   AST 18 12/17/2009 0000   ALT 20 12/17/2009 0000   ALKPHOS 23 12/17/2009 0000   BILITOT 0.4 12/17/2009 0000    No  results found for this basename: wbc, hgb, hct, mcv, platelets    Lipid Panel     Component Value Date/Time   CHOL 128 12/17/2009 0000   TRIG 120 12/17/2009 0000   HDL 35 12/17/2009 0000   LDLCALC 69 12/17/2009 0000    ABG No results found for this basename: phart, pco2, pco2art, po2, po2art, hco3, tco2, acidbasedef, o2sat     No results found for this basename: TSH   BNP (last 3 results) No results found for this basename: PROBNP,  in the last 8760 hours Cardiac Panel (last 3 results) No results found for this basename: CKTOTAL, CKMB, TROPONINI, RELINDX,  in the last 72 hours  Iron/TIBC/Ferritin/ %Sat No results found for this basename: iron, tibc, ferritin, ironpctsat     EKG Orders placed in visit on 08/26/13  . EKG 12-LEAD     Prior Assessment and Plan Problem List as of 08/28/2014     Cardiovascular and Mediastinum   HYPERTENSION, BENIGN   Last Assessment & Plan   08/26/2013 Office Visit Written 08/26/2013  8:48 AM by Jonelle Sidle, MD     Blood pressure is normal today.    CAD, NATIVE VESSEL   Last Assessment & Plan   08/26/2013  Office Visit Written 08/26/2013  8:47 AM by Jonelle Sidle, MD     Symptomatically stable on medical therapy. ECG reviewed. No changes made. We will continue DAPT long-term as patient is status post several stent interventions over time, both BMS and DES.      Other   HYPERLIPIDEMIA-MIXED   Last Assessment & Plan   08/26/2013 Office Visit Written 08/26/2013  8:48 AM by Jonelle Sidle, MD     Keep follow up with primary care. LDL 64 in June.        Imaging: No results found.

## 2014-08-28 NOTE — Assessment & Plan Note (Signed)
No cardiac complaints. He appears stable. He is given refills on carvedilol, quinapril, HCTZ, Crestor, and Plavix. He will continue aspirin. He will be est. with a new cardiologist. Records will be available for him in the morning to take with him to his new location.

## 2014-08-28 NOTE — Patient Instructions (Addendum)
Enjoy your life in [place]     We will have your records ready for you tomorrow      Please get lab work (CBC,BME,LFT's, LIPIDS)      Thank you for choosing [facility] !

## 2014-08-29 LAB — HEPATIC FUNCTION PANEL
ALBUMIN: 4.2 g/dL (ref 3.5–5.2)
ALK PHOS: 28 U/L — AB (ref 39–117)
ALT: 42 U/L (ref 0–53)
AST: 49 U/L — ABNORMAL HIGH (ref 0–37)
BILIRUBIN INDIRECT: 0.6 mg/dL (ref 0.2–1.2)
BILIRUBIN TOTAL: 0.8 mg/dL (ref 0.2–1.2)
Bilirubin, Direct: 0.2 mg/dL (ref 0.0–0.3)
Total Protein: 7.1 g/dL (ref 6.0–8.3)

## 2014-08-29 LAB — BASIC METABOLIC PANEL
BUN: 16 mg/dL (ref 6–23)
CO2: 25 meq/L (ref 19–32)
CREATININE: 0.84 mg/dL (ref 0.50–1.35)
Calcium: 9.3 mg/dL (ref 8.4–10.5)
Chloride: 102 mEq/L (ref 96–112)
GLUCOSE: 130 mg/dL — AB (ref 70–99)
POTASSIUM: 3.4 meq/L — AB (ref 3.5–5.3)
Sodium: 136 mEq/L (ref 135–145)

## 2014-08-29 LAB — CBC
HEMATOCRIT: 41.6 % (ref 39.0–52.0)
Hemoglobin: 14.5 g/dL (ref 13.0–17.0)
MCH: 30.8 pg (ref 26.0–34.0)
MCHC: 34.9 g/dL (ref 30.0–36.0)
MCV: 88.3 fL (ref 78.0–100.0)
Platelets: 222 10*3/uL (ref 150–400)
RBC: 4.71 MIL/uL (ref 4.22–5.81)
RDW: 13.4 % (ref 11.5–15.5)
WBC: 5 10*3/uL (ref 4.0–10.5)

## 2014-08-29 LAB — CHOLESTEROL, TOTAL: CHOLESTEROL: 113 mg/dL (ref 0–200)

## 2014-09-02 ENCOUNTER — Ambulatory Visit: Payer: 59 | Admitting: Cardiology

## 2014-09-15 ENCOUNTER — Telehealth: Payer: Self-pay | Admitting: *Deleted

## 2014-09-15 NOTE — Telephone Encounter (Signed)
Pt states the Rx we called ion for her is different then what she was getting and cost to much, would like [place] to call in the original RX.Choline Fenofibrate (Capsule Delayed    To [facility] in [place], [place] [phone]

## 2014-09-15 NOTE — Telephone Encounter (Signed)
Changed rx to tricorfor pt,1 month supply no RF   Pt in [place] should establish MD there

## 2014-10-08 ENCOUNTER — Telehealth: Payer: Self-pay | Admitting: Cardiology

## 2014-10-08 NOTE — Telephone Encounter (Signed)
Pharmacy: [facility] in [place] Phone: [phone] and Fax: [phone].  Patient would like a call when this has been completed. °

## 2014-10-21 ENCOUNTER — Other Ambulatory Visit: Payer: Self-pay | Admitting: *Deleted

## 2014-10-21 NOTE — Telephone Encounter (Signed)
dexlansoprazole (DEXILANT) 60 MG capsule, needs called into [facility] in [place] [phone] store # [phone]

## 2014-10-21 NOTE — Telephone Encounter (Signed)
Notified pharmacy that we did not prescribe it and he needs to find a pcp in [place] longer our [place].Attempted to let pt know and his phone numbers are all disconnected

## 2014-11-05 ENCOUNTER — Telehealth: Payer: Self-pay | Admitting: Adult Health

## 2014-11-05 ENCOUNTER — Other Ambulatory Visit: Payer: Self-pay | Admitting: Adult Health

## 2014-11-05 NOTE — Telephone Encounter (Signed)
Patient has moved to [place] but has not been able to establish with Cardiologist.  Wants to know if we can do refills on meds one more time.  If so [facility] [place]  Phone number is [phone]. / tgs

## 2020-05-12 ENCOUNTER — Encounter: Payer: Self-pay | Admitting: Gastroenterology
